# Patient Record
Sex: Male | Born: 1955 | Race: White | Hispanic: No | Marital: Single | Smoking: Never smoker
Health system: Southern US, Community
[De-identification: ages and names within clinical notes are randomized; demographics above are authoritative.]

## PROBLEM LIST (undated history)

## (undated) DIAGNOSIS — I10 Essential (primary) hypertension: Secondary | ICD-10-CM

## (undated) DIAGNOSIS — I219 Acute myocardial infarction, unspecified: Secondary | ICD-10-CM

## (undated) DIAGNOSIS — I2510 Atherosclerotic heart disease of native coronary artery without angina pectoris: Secondary | ICD-10-CM

## (undated) DIAGNOSIS — E785 Hyperlipidemia, unspecified: Secondary | ICD-10-CM

## (undated) HISTORY — DX: Essential (primary) hypertension: I10

## (undated) HISTORY — PX: TONSILLECTOMY: SUR1361

## (undated) HISTORY — PX: WISDOM TOOTH EXTRACTION: SHX21

## (undated) HISTORY — PX: ACHILLES TENDON REPAIR: SUR1153

## (undated) HISTORY — DX: Atherosclerotic heart disease of native coronary artery without angina pectoris: I25.10

## (undated) HISTORY — DX: Hyperlipidemia, unspecified: E78.5

---

## 2012-10-18 DIAGNOSIS — I219 Acute myocardial infarction, unspecified: Secondary | ICD-10-CM

## 2012-10-18 HISTORY — DX: Acute myocardial infarction, unspecified: I21.9

## 2013-05-22 ENCOUNTER — Encounter (HOSPITAL_COMMUNITY): Payer: Self-pay | Admitting: Emergency Medicine

## 2013-05-22 ENCOUNTER — Emergency Department (HOSPITAL_COMMUNITY): Payer: BC Managed Care – PPO

## 2013-05-22 ENCOUNTER — Encounter (HOSPITAL_COMMUNITY)
Admission: EM | Disposition: A | Discharge status: Home / Self Care | Payer: Self-pay | Source: Home / Self Care | Attending: Cardiology

## 2013-05-22 ENCOUNTER — Inpatient Hospital Stay (HOSPITAL_COMMUNITY)
Admission: EM | Admit: 2013-05-22 | Discharge: 2013-05-24 | DRG: 853 | Disposition: A | Discharge status: Home / Self Care | Payer: BC Managed Care – PPO | Attending: Cardiology | Admitting: Cardiology

## 2013-05-22 DIAGNOSIS — Z79899 Other long term (current) drug therapy: Secondary | ICD-10-CM

## 2013-05-22 DIAGNOSIS — N529 Male erectile dysfunction, unspecified: Secondary | ICD-10-CM | POA: Diagnosis present

## 2013-05-22 DIAGNOSIS — I472 Ventricular tachycardia, unspecified: Secondary | ICD-10-CM | POA: Diagnosis present

## 2013-05-22 DIAGNOSIS — Z955 Presence of coronary angioplasty implant and graft: Secondary | ICD-10-CM

## 2013-05-22 DIAGNOSIS — Z7982 Long term (current) use of aspirin: Secondary | ICD-10-CM

## 2013-05-22 DIAGNOSIS — I4729 Other ventricular tachycardia: Secondary | ICD-10-CM | POA: Diagnosis present

## 2013-05-22 DIAGNOSIS — E876 Hypokalemia: Secondary | ICD-10-CM | POA: Diagnosis not present

## 2013-05-22 DIAGNOSIS — I10 Essential (primary) hypertension: Secondary | ICD-10-CM | POA: Diagnosis present

## 2013-05-22 DIAGNOSIS — I2119 ST elevation (STEMI) myocardial infarction involving other coronary artery of inferior wall: Principal | ICD-10-CM | POA: Diagnosis present

## 2013-05-22 DIAGNOSIS — N289 Disorder of kidney and ureter, unspecified: Secondary | ICD-10-CM | POA: Diagnosis not present

## 2013-05-22 DIAGNOSIS — E7800 Pure hypercholesterolemia, unspecified: Secondary | ICD-10-CM | POA: Diagnosis present

## 2013-05-22 DIAGNOSIS — Z7902 Long term (current) use of antithrombotics/antiplatelets: Secondary | ICD-10-CM

## 2013-05-22 DIAGNOSIS — I213 ST elevation (STEMI) myocardial infarction of unspecified site: Secondary | ICD-10-CM

## 2013-05-22 HISTORY — PX: LEFT HEART CATHETERIZATION WITH CORONARY ANGIOGRAM: SHX5451

## 2013-05-22 LAB — POCT I-STAT, CHEM 8
BUN: 16 mg/dL (ref 6–23)
Creatinine, Ser: 1.4 mg/dL — ABNORMAL HIGH (ref 0.50–1.35)
Glucose, Bld: 135 mg/dL — ABNORMAL HIGH (ref 70–99)
Hemoglobin: 18.7 g/dL — ABNORMAL HIGH (ref 13.0–17.0)
TCO2: 26 mmol/L (ref 0–100)

## 2013-05-22 LAB — MAGNESIUM: Magnesium: 2 mg/dL (ref 1.5–2.5)

## 2013-05-22 LAB — COMPREHENSIVE METABOLIC PANEL
ALT: 40 U/L (ref 0–53)
AST: 31 U/L (ref 0–37)
Alkaline Phosphatase: 64 U/L (ref 39–117)
CO2: 25 mEq/L (ref 19–32)
Chloride: 100 mEq/L (ref 96–112)
GFR calc Af Amer: 86 mL/min — ABNORMAL LOW (ref >90)
GFR calc non Af Amer: 74 mL/min — ABNORMAL LOW (ref >90)
Glucose, Bld: 133 mg/dL — ABNORMAL HIGH (ref 70–99)
Potassium: 3.6 mEq/L (ref 3.5–5.1)
Sodium: 137 mEq/L (ref 135–145)
Total Bilirubin: 0.4 mg/dL (ref 0.3–1.2)

## 2013-05-22 LAB — DIFFERENTIAL
Basophils Absolute: 0 10*3/uL (ref 0.0–0.1)
Basophils Relative: 0 % (ref 0–1)
Eosinophils Absolute: 0.2 10*3/uL (ref 0.0–0.7)
Neutro Abs: 5 10*3/uL (ref 1.7–7.7)
Neutrophils Relative %: 66 % (ref 43–77)

## 2013-05-22 LAB — LIPID PANEL
Cholesterol: 216 mg/dL — ABNORMAL HIGH (ref 0–200)
Total CHOL/HDL Ratio: 4.4 RATIO
VLDL: 51 mg/dL — ABNORMAL HIGH (ref 0–40)

## 2013-05-22 LAB — TROPONIN I: Troponin I: 19.07 ng/mL (ref <0.30)

## 2013-05-22 LAB — CBC
Hemoglobin: 18.4 g/dL — ABNORMAL HIGH (ref 13.0–17.0)
MCH: 33.5 pg (ref 26.0–34.0)
Platelets: 208 10*3/uL (ref 150–400)
RBC: 5.49 MIL/uL (ref 4.22–5.81)
WBC: 7.4 10*3/uL (ref 4.0–10.5)

## 2013-05-22 LAB — MRSA PCR SCREENING: MRSA by PCR: NEGATIVE

## 2013-05-22 SURGERY — LEFT HEART CATHETERIZATION WITH CORONARY ANGIOGRAM
Anesthesia: LOCAL

## 2013-05-22 MED ORDER — HEPARIN SODIUM (PORCINE) 5000 UNIT/ML IJ SOLN
4000.0000 [IU] | INTRAMUSCULAR | Status: AC
  Administered 2013-05-22: 4000 [IU] via INTRAVENOUS
  Filled 2013-05-22: qty 1

## 2013-05-22 MED ORDER — ALUM & MAG HYDROXIDE-SIMETH 200-200-20 MG/5ML PO SUSP
30.0000 mL | ORAL | Status: DC | PRN
Start: 2013-05-22 — End: 2013-05-24
  Administered 2013-05-22: 30 mL via ORAL
  Filled 2013-05-22: qty 30

## 2013-05-22 MED ORDER — SODIUM CHLORIDE 0.9 % IV SOLN
INTRAVENOUS | Status: DC
  Administered 2013-05-22: 18:00:00 via INTRAVENOUS

## 2013-05-22 MED ORDER — BIVALIRUDIN 250 MG IV SOLR
INTRAVENOUS | Status: AC
  Filled 2013-05-22: qty 250

## 2013-05-22 MED ORDER — ONDANSETRON HCL 4 MG/2ML IJ SOLN: 4.0000 mg | Freq: Four times a day (QID) | INTRAMUSCULAR | Status: DC | PRN

## 2013-05-22 MED ORDER — MORPHINE SULFATE 2 MG/ML IJ SOLN
INTRAMUSCULAR | Status: AC
  Administered 2013-05-22: 2 mg via INTRAVENOUS
  Filled 2013-05-22: qty 1

## 2013-05-22 MED ORDER — ACETAMINOPHEN 325 MG PO TABS: 650.0000 mg | ORAL_TABLET | ORAL | Status: DC | PRN

## 2013-05-22 MED ORDER — ALPRAZOLAM 0.25 MG PO TABS
0.2500 mg | ORAL_TABLET | Freq: Two times a day (BID) | ORAL | Status: DC | PRN
  Administered 2013-05-22 – 2013-05-23 (×2): 0.25 mg via ORAL
  Filled 2013-05-22 (×2): qty 1

## 2013-05-22 MED ORDER — METOPROLOL TARTRATE 12.5 MG HALF TABLET
12.5000 mg | ORAL_TABLET | Freq: Two times a day (BID) | ORAL | Status: DC
  Administered 2013-05-22: 12.5 mg via ORAL
  Filled 2013-05-22 (×2): qty 1

## 2013-05-22 MED ORDER — NITROGLYCERIN 0.2 MG/ML ON CALL CATH LAB
INTRAVENOUS | Status: AC
  Filled 2013-05-22: qty 1

## 2013-05-22 MED ORDER — SODIUM CHLORIDE 0.9 % IV SOLN: INTRAVENOUS | Status: AC

## 2013-05-22 MED ORDER — POTASSIUM CHLORIDE CRYS ER 20 MEQ PO TBCR
40.0000 meq | EXTENDED_RELEASE_TABLET | Freq: Once | ORAL | Status: AC
  Administered 2013-05-22: 40 meq via ORAL
  Filled 2013-05-22: qty 2

## 2013-05-22 MED ORDER — ASPIRIN 81 MG PO CHEW: 324.0000 mg | CHEWABLE_TABLET | ORAL | Status: AC

## 2013-05-22 MED ORDER — MORPHINE SULFATE 2 MG/ML IJ SOLN
2.0000 mg | INTRAMUSCULAR | Status: DC | PRN
  Administered 2013-05-23: 2 mg via INTRAVENOUS
  Filled 2013-05-22: qty 1

## 2013-05-22 MED ORDER — TICAGRELOR 90 MG PO TABS
180.0000 mg | ORAL_TABLET | ORAL | Status: AC
  Administered 2013-05-22: 180 mg via ORAL
  Filled 2013-05-22: qty 2

## 2013-05-22 MED ORDER — MIDAZOLAM HCL 2 MG/2ML IJ SOLN
INTRAMUSCULAR | Status: AC
  Filled 2013-05-22: qty 2

## 2013-05-22 MED ORDER — NITROGLYCERIN IN D5W 200-5 MCG/ML-% IV SOLN: 5.0000 ug/min | INTRAVENOUS | Status: DC

## 2013-05-22 MED ORDER — PANTOPRAZOLE SODIUM 40 MG PO TBEC
40.0000 mg | DELAYED_RELEASE_TABLET | Freq: Every day | ORAL | Status: DC
  Administered 2013-05-22 – 2013-05-24 (×3): 40 mg via ORAL
  Filled 2013-05-22 (×3): qty 1

## 2013-05-22 MED ORDER — HEPARIN (PORCINE) IN NACL 2-0.9 UNIT/ML-% IJ SOLN
INTRAMUSCULAR | Status: AC
  Filled 2013-05-22: qty 1000

## 2013-05-22 MED ORDER — ATORVASTATIN CALCIUM 80 MG PO TABS
80.0000 mg | ORAL_TABLET | Freq: Every day | ORAL | Status: DC
  Administered 2013-05-22 – 2013-05-23 (×2): 80 mg via ORAL
  Filled 2013-05-22 (×3): qty 1

## 2013-05-22 MED ORDER — FENTANYL CITRATE 0.05 MG/ML IJ SOLN
INTRAMUSCULAR | Status: AC
  Filled 2013-05-22: qty 2

## 2013-05-22 MED ORDER — ASPIRIN 300 MG RE SUPP: 300.0000 mg | RECTAL | Status: AC

## 2013-05-22 MED ORDER — SODIUM CHLORIDE 0.9 % IV SOLN
0.2500 mg/kg/h | INTRAVENOUS | Status: AC
  Administered 2013-05-22: 0.25 mg/kg/h via INTRAVENOUS

## 2013-05-22 MED ORDER — METOPROLOL TARTRATE 25 MG PO TABS
25.0000 mg | ORAL_TABLET | Freq: Two times a day (BID) | ORAL | Status: DC
  Administered 2013-05-22 – 2013-05-24 (×4): 25 mg via ORAL
  Filled 2013-05-22 (×5): qty 1

## 2013-05-22 MED ORDER — TICAGRELOR 90 MG PO TABS
90.0000 mg | ORAL_TABLET | Freq: Two times a day (BID) | ORAL | Status: DC
  Administered 2013-05-22 – 2013-05-24 (×5): 90 mg via ORAL
  Filled 2013-05-22 (×6): qty 1

## 2013-05-22 MED ORDER — NITROGLYCERIN 0.4 MG SL SUBL: 0.4000 mg | SUBLINGUAL_TABLET | SUBLINGUAL | Status: DC | PRN

## 2013-05-22 MED ORDER — ASPIRIN 81 MG PO CHEW
324.0000 mg | CHEWABLE_TABLET | Freq: Once | ORAL | Status: AC
  Administered 2013-05-22: 324 mg via ORAL
  Filled 2013-05-22: qty 4

## 2013-05-22 MED ORDER — LIDOCAINE HCL (PF) 1 % IJ SOLN
INTRAMUSCULAR | Status: AC
  Filled 2013-05-22: qty 30

## 2013-05-22 MED ORDER — ASPIRIN EC 81 MG PO TBEC
81.0000 mg | DELAYED_RELEASE_TABLET | Freq: Every day | ORAL | Status: DC
Start: 2013-05-23 — End: 2013-05-24
  Administered 2013-05-23 – 2013-05-24 (×2): 81 mg via ORAL
  Filled 2013-05-22 (×2): qty 1

## 2013-05-22 NOTE — Progress Notes (Signed)
Pt had a 4 beat run of non sustian vtach. Pt felt transient heaviness in chest and  Racing of hear.. Will continue to monitor

## 2013-05-22 NOTE — Cardiovascular Report (Signed)
NAMEHERNANDEZ, LOSASSO NO.:  [id number]  MEDICAL RECORD NO.:  [id number]  LOCATION:  2908                         FACILITY:  [facility]  PHYSICIAN:  Eduardo Osier. Sharyn Lull, M.D. DATE OF BIRTH:  Jul 11, 1956  DATE OF PROCEDURE:  05/22/2013 DATE OF DISCHARGE:                           CARDIAC CATHETERIZATION   PROCEDURES: 1. Left cardiac cath with selective left and right coronary     angiography, left ventriculography via right groin using Judkins     technique. 2. Successful percutaneous transluminal coronary angioplasty to apical     left anterior descending using 2.0 x 12-mm long Sprinter balloon 3. Successful deployment of 2.5 x 18-mm long Xience expedition drug-     eluting stent in epical left anterior descending.  INDICATION FOR THE PROCEDURE:  Mr. Salzman is 57 year old male with no significant past medical history except for hypercholesteremia, controlled by diet.  He drove to the ER complaining of retrosternal chest pain, described as burning, pressure, grade 8/10, radiating to left arm associated with nausea and diaphoresis.  He took antacids without relief, so decided to drive to the ER.  EKG done in the ER showed normal sinus rhythm with ST elevation in inferolateral leads and reciprocal changes in aVL, and also poor R-wave progression in V1 to V3, suggestive of acute anterolateral injury.  The patient denies such episodes of chest pain in the past.  Denies any cocaine abuse.  States he takes NSAIDs and testosterone off and on.  PHYSICAL EXAMINATION:  GENERAL:  He was alert, awake, oriented x3, in no acute distress. VITAL SIGNS:  Blood pressure was 183/100, pulse was 84.  He was afebrile. HEENT:  Conjunctivae was pink. NECK:  Supple.  No JVD.  No bruit. LUNGS:  Clear to auscultation without rhonchi or rales. CARDIOVASCULAR:  S1, S2 was normal.  There was soft systolic murmur and S4 gallop. ABDOMEN:  Soft.  Bowel sounds were present.  Nontender. EXTREMITIES:   There was no clubbing, cyanosis, or edema.  IMPRESSION:  Acute inferolateral injury, new-onset hypertension, hypercholesteremia, discussed with the patient regarding emergency left cath, possible PTCA stenting, its risks and benefits, i.e., death, MI, stroke, need for emergency CABG, local vascular complications, etc. and consented for PCI procedure.  PROCEDURE IN DETAIL:  After obtaining the informed consent, the patient was brought to the cath lab and was placed on fluoroscopy table.  Right groin was prepped and draped in usual fashion.  Xylocaine 1% was used for local anesthesia in the right groin.  With the help of thin wall needle, 6-French arterial sheath was placed.  The sheath was aspirated and flushed.  Next, 6-French left Judkins catheter was advanced over the wire under fluoroscopic guidance up to the ascending aorta.  Wire was pulled out.  The catheter was aspirated and connected to the Manifold. Catheter was further advanced and engaged into left coronary ostium. Multiple views of the left system were taken.  Next, catheter was disengaged and was pulled out over the wire and was replaced with 6- [place] right Judkins catheter, which was advanced over the wire under fluoroscopic guidance up to the ascending aorta.  Wire was pulled out. The catheter was aspirated and connected to  the Manifold.  Catheter was further advanced and engaged into right coronary ostium.  Multiple views of the right system were taken.  Next, catheter was disengaged and was pulled out over the wire and was replaced with 6-French pigtail catheter at the end of the procedure, which was advanced over the wire under fluoroscopic guidance up to the ascending aorta.  Catheter was further advanced across the aortic valve into the LV.  LV pressures were recorded.  Next, LV graft was done in 30-degree RAO position.  Post- angiographic pressures were recorded from LV and then pullback pressures were recorded  from aorta.  There was no gradient across the aortic valve.  Next, the pigtail catheter was pulled out over the wire. Sheaths were aspirated and flushed.  FINDINGS:  LV showed anterolateral/apical hypokinesia, EF of approximately 50-55%.  Left main was patent.  LAD has 30-40% mid and distal focal stenosis and 95-99% apical stenosis.  Diagonal 1 has 80-90% proximal bifurcation stenosis.  Vessel is very small.  Mid TIMI-3 flow. Diagonal 2 and 3 were very very small.  Ramus has 20-25% proximal stenosis.  Left circumflex is large with minimal plaquing.  OM 1 to OM 3 were very small.  OM 4 has 40-50% proximal stenosis.  RCA has 30-40% proximal and 20-25% mid stenosis.  PDA and PLV branches were very small. The patient has codominant coronary system.  INTERVENTIONAL PROCEDURE:  Successful PTCA to apical LAD was done using 2.0 x 12-mm long Sprinter balloon for predilatation and then 2.5 x 18-mm long Xience drug-eluting stent was deployed in the apical LAD at 8 atmospheric pressure.  The stent was post dilated going up to 10 atmospheric pressure.  Lesion dilated from 95-99% to 0% residual with excellent TIMI grade 3 distal flow without evidence of dissection or distal embolization.  The patient received weight-based Angiomax and 180 mg of Brilinta prior to the procedure.  The patient tolerated the procedure well.  There were no complications.  The patient was transferred to CCU in stable condition.     Eduardo Osier. Sharyn Lull, M.D.     [facility]/MEDQ  D:  05/22/2013  T:  05/22/2013  Job:  [id number]

## 2013-05-22 NOTE — Progress Notes (Signed)
Subjective:  Patient denies any anginal chest pain or shortness of breath. Denies any palpitations. Had episode of a few beats of nonsustained VT on the monitor asymptomatic potassium is 3.5 which is being replaced. ST elevation resolved after PCI in inferior leads.  Objective:  Vital Signs in the last 24 hours: Temp:  [97.7 F (36.5 C)-98.5 F (36.9 C)] 97.7 F (36.5 C) (08/05 0800) Pulse Rate:  [56-84] 66 (08/05 0845) Resp:  [14-29] 22 (08/05 0845) BP: (141-186)/(69-106) 148/69 mmHg (08/05 0845) SpO2:  [97 %-99 %] 98 % (08/05 0845) Arterial Line BP: (147-158)/(80-85) 158/80 mmHg (08/05 0800) Weight:  [106.595 kg (235 lb)-111.4 kg (245 lb 9.5 oz)] 111.4 kg (245 lb 9.5 oz) (08/05 0445)  Intake/Output from previous day: 08/04 0701 - 08/05 0700 In: 55.9 [I.V.:55.9] Out: -  Intake/Output from this shift: Total I/O In: 146 [P.O.:120; I.V.:26] Out: 700 [Urine:700]  Physical Exam: Neck: no adenopathy, no carotid bruit, no JVD and supple, symmetrical, trachea midline Lungs: clear to auscultation bilaterally Heart: regular rate and rhythm, S1, S2 normal and Soft systolic murmur and S4 gallop noted Abdomen: soft, non-tender; bowel sounds normal; no masses,  no organomegaly Extremities: No clubbing cyanosis or edema right groin stable dressing dry no bruit  Lab Results:  Recent Labs  05/22/13 0210 05/22/13 0223  WBC 7.4  --   HGB 18.4* 18.7*  PLT 208  --     Recent Labs  05/22/13 0210 05/22/13 0223  NA 137 140  K 3.6 3.5  CL 100 101  CO2 25  --   GLUCOSE 133* 135*  BUN 17 16  CREATININE 1.10 1.40*   No results found for this basename: TROPONINI, CK, MB,  in the last 72 hours Hepatic Function Panel  Recent Labs  05/22/13 0210  PROT 7.0  ALBUMIN 3.9  AST 31  ALT 40  ALKPHOS 64  BILITOT 0.4    Recent Labs  05/22/13 0210  CHOL 216*   No results found for this basename: PROTIME,  in the last 72 hours  Imaging: Imaging results have been reviewed and Dg  Chest Port 1 View  05/22/2013   *RADIOLOGY REPORT*  Clinical Data: Chest pain.  PORTABLE CHEST - 1 VIEW  Comparison: None.  Findings: The lungs are clear.  Heart size is normal.  No pneumothorax or pleural fluid.  No focal bony abnormality.  IMPRESSION: Negative exam.   Original Report Authenticated By: Holley Dexter, M.D.    Cardiac Studies:  Assessment/Plan:  Status post acute inferior apical wall myocardial infarction status post PCI to apical LAD with excellent results Hypertension Status post nonsustained VT asymptomatic Hypokalemia Mild renal insufficiency Plan Replace K. Check labs Continue present management as per orders  LOS: 0 days    Pepper Kerrick N 05/22/2013, 10:19 AM

## 2013-05-22 NOTE — Progress Notes (Signed)
RFA sheath removed at 0815 without complications. Pressure to site x 25 min .Site level zero.Right DP palpable.Dressing applied,pt teaching done.       canderson rn

## 2013-05-22 NOTE — Care Management Note (Signed)
    Page 1 of 1   05/22/2013     10:32:36 AM   CARE MANAGEMENT NOTE 05/22/2013  Patient:  Donald Calhoun,Donald Calhoun   Account Number:  [id number]  Date Initiated:  05/22/2013  Documentation initiated by:  Junius Creamer  Subjective/Objective Assessment:   adm w mi     Action/Plan:   lives w fam  pt states has bcbs ins and gave copay of card on adm   Anticipated DC Date:     Anticipated DC Plan:  HOME/SELF CARE      DC Planning Services  CM consult  Medication Assistance      Choice offered to / List presented to:             Status of service:   Medicare Important Message given?   (If response is "NO", the following Medicare IM given date fields will be blank) Date Medicare IM given:   Date Additional Medicare IM given:    Discharge Disposition:  HOME/SELF CARE  Per UR Regulation:  Reviewed for med. necessity/level of care/duration of stay  If discussed at Long Length of Stay Meetings, dates discussed:    Comments:  8/5 1031a debbie Michiko Lineman rn,bsn spoke w pt. gave pt brilinta 30day free and copay assist card. pt states has bcbs and gave copy of card when adm.

## 2013-05-22 NOTE — ED Notes (Addendum)
Pt reports approx 1 hour ago he began to have mid-sternal chest heaviness with diaphoresis.  Pain radiates down left arm. Denies sob, nv. No cardiac hx. Pt took antacids at home with no relief.

## 2013-05-22 NOTE — H&P (Signed)
Donald Calhoun is an 56 y.o. male.   Chief Complaint: Chest pain HPI: Patient is 57 year old male with no significant past medical history except for hypercholesteremia controlled by diet control to the ER complaining of retrosternal chest pain described as burning pressure grade 8/10 radiating to left arm associated with nausea and diaphoresis took antacids without relief so decided to to the ER EKG done in the ER showed normal sinus rhythm with ST elevation in inferolateral leads with reciprocal changes in aVL and over R-wave progression in V1 to V3 suggestive of acute inferior lateral injury. Patient denies such episodes of chest pain in the past. Denies any cocaine abuse. States takes her NSAIDs and testosterone off and on.  History reviewed. No pertinent past medical history.  History reviewed. No pertinent past surgical history.  No family history on file. Social History:  reports that he has never smoked. He does not have any smokeless tobacco history on file. He reports that  drinks alcohol. He reports that he does not use illicit drugs.  Allergies: No Known Allergies  Medications Prior to Admission  Medication Sig Dispense Refill  . lisdexamfetamine (VYVANSE) 50 MG capsule Take 100 mg by mouth every morning.      . testosterone cypionate (DEPOTESTOTERONE CYPIONATE) 100 MG/ML injection Inject 100 mg into the muscle every 7 (seven) days. On Friday        Results for orders placed during the hospital encounter of 05/22/13 (from the past 48 hour(s))  APTT     Status: None   Collection Time    05/22/13  2:10 AM      Result Value Range   aPTT 27  24 - 37 seconds  CBC     Status: Abnormal   Collection Time    05/22/13  2:10 AM      Result Value Range   WBC 7.4  4.0 - 10.5 K/uL   RBC 5.49  4.22 - 5.81 MIL/uL   Hemoglobin 18.4 (*) 13.0 - 17.0 g/dL   HCT 16.1  09.6 - 04.5 %   MCV 93.6  78.0 - 100.0 fL   MCH 33.5  26.0 - 34.0 pg   MCHC 35.8  30.0 - 36.0 g/dL   RDW 40.9  81.1 - 91.4 %    Platelets 208  150 - 400 K/uL  COMPREHENSIVE METABOLIC PANEL     Status: Abnormal   Collection Time    05/22/13  2:10 AM      Result Value Range   Sodium 137  135 - 145 mEq/L   Potassium 3.6  3.5 - 5.1 mEq/L   Chloride 100  96 - 112 mEq/L   CO2 25  19 - 32 mEq/L   Glucose, Bld 133 (*) 70 - 99 mg/dL   BUN 17  6 - 23 mg/dL   Creatinine, Ser 7.82  0.50 - 1.35 mg/dL   Calcium 9.3  8.4 - 95.6 mg/dL   Total Protein 7.0  6.0 - 8.3 g/dL   Albumin 3.9  3.5 - 5.2 g/dL   AST 31  0 - 37 U/L   ALT 40  0 - 53 U/L   Alkaline Phosphatase 64  39 - 117 U/L   Total Bilirubin 0.4  0.3 - 1.2 mg/dL   GFR calc non Af Amer 74 (*) >90 mL/min   GFR calc Af Amer 86 (*) >90 mL/min   Comment:            The eGFR has been calculated  using the CKD EPI equation.     This calculation has not been     validated in all clinical     situations.     eGFR's persistently     <90 mL/min signify     possible Chronic Kidney Disease.  PROTIME-INR     Status: None   Collection Time    05/22/13  2:10 AM      Result Value Range   Prothrombin Time 13.1  11.6 - 15.2 seconds   INR 1.01  0.00 - 1.49  POCT I-STAT TROPONIN I     Status: None   Collection Time    05/22/13  2:20 AM      Result Value Range   Troponin i, poc 0.04  0.00 - 0.08 ng/mL   Comment 3            Comment: Due to the release kinetics of cTnI,     a negative result within the first hours     of the onset of symptoms does not rule out     myocardial infarction with certainty.     If myocardial infarction is still suspected,     repeat the test at appropriate intervals.  POCT I-STAT, CHEM 8     Status: Abnormal   Collection Time    05/22/13  2:23 AM      Result Value Range   Sodium 140  135 - 145 mEq/L   Potassium 3.5  3.5 - 5.1 mEq/L   Chloride 101  96 - 112 mEq/L   BUN 16  6 - 23 mg/dL   Creatinine, Ser 7.82 (*) 0.50 - 1.35 mg/dL   Glucose, Bld 956 (*) 70 - 99 mg/dL   Calcium, Ion 2.13  0.86 - 1.23 mmol/L   TCO2 26  0 - 100 mmol/L    Hemoglobin 18.7 (*) 13.0 - 17.0 g/dL   HCT 57.8 (*) 46.9 - 62.9 %   Dg Chest Port 1 View  05/22/2013   *RADIOLOGY REPORT*  Clinical Data: Chest pain.  PORTABLE CHEST - 1 VIEW  Comparison: None.  Findings: The lungs are clear.  Heart size is normal.  No pneumothorax or pleural fluid.  No focal bony abnormality.  IMPRESSION: Negative exam.   Original Report Authenticated By: Holley Dexter, M.D.    Review of Systems  Constitutional: Negative for fever.  Eyes: Negative for blurred vision and double vision.  Respiratory: Negative for cough, hemoptysis and sputum production.   Cardiovascular: Positive for chest pain. Negative for palpitations, orthopnea, claudication, leg swelling and PND.  Gastrointestinal: Positive for nausea. Negative for vomiting and abdominal pain.  Neurological: Negative for dizziness and headaches.    Blood pressure 183/100, pulse 84, temperature 98 F (36.7 C), temperature source Oral, resp. rate 29, height 6\' 3"  (1.905 m), weight 106.595 kg (235 lb), SpO2 98.00%. Physical Exam  Constitutional: He is oriented to person, place, and time.  HENT:  Head: Normocephalic and atraumatic.  Eyes: Conjunctivae are normal. Pupils are equal, round, and reactive to light. Left eye exhibits no discharge.  Neck: Normal range of motion. Neck supple. No JVD present. No thyromegaly present.  Cardiovascular: Normal rate and regular rhythm.   Murmur (Soft systolic murmur and S4 gallop noted) heard. Respiratory: Effort normal and breath sounds normal. No respiratory distress. He has no wheezes. He has no rales.  GI: Bowel sounds are normal.  Musculoskeletal: He exhibits no edema and no tenderness.  Neurological: He is alert and oriented to person,  place, and time.     Assessment/Plan Acute inferolateral injury The onset hypertension Hypercholesteremia Plan Discussed with patient regarding emergency left cath possible PTCA stenting its risk and benefits i.e. death MI stroke need  for emergency CABG local last complications etc. and consented for PCI  [facility] N 05/22/2013, 4:11 AM

## 2013-05-22 NOTE — CV Procedure (Signed)
(/  PTCA stenting report dictated on 05/22/2013 dictation number is [phone]

## 2013-05-22 NOTE — Progress Notes (Signed)
Subjective:  Patient complains of vague pleuritic chest pain earlier today localized without associated symptoms repeat EKG showed no acute ischemic changes. Had frequent episodes of nonsustained VT asymptomatic. Feels better now  Objective:  Vital Signs in the last 24 hours: Temp:  [97.7 F (36.5 C)-98.5 F (36.9 C)] 97.8 F (36.6 C) (08/05 1600) Pulse Rate:  [53-84] 53 (08/05 1200) Resp:  [14-29] 17 (08/05 1200) BP: (129-186)/(69-106) 130/76 mmHg (08/05 1600) SpO2:  [97 %-99 %] 97 % (08/05 1600) Arterial Line BP: (147-158)/(80-85) 158/80 mmHg (08/05 0800) Weight:  [106.595 kg (235 lb)-111.4 kg (245 lb 9.5 oz)] 111.4 kg (245 lb 9.5 oz) (08/05 0445)  Intake/Output from previous day: 08/04 0701 - 08/05 0700 In: 55.9 [I.V.:55.9] Out: -  Intake/Output from this shift: Total I/O In: 1020 [P.O.:840; I.V.:180] Out: 700 [Urine:700]  Physical Exam: Neck: no adenopathy, no carotid bruit, no JVD and supple, symmetrical, trachea midline Lungs: clear to auscultation bilaterally Heart: regular rate and rhythm, S1, S2 normal and Soft systolic murmur noted Abdomen: soft, non-tender; bowel sounds normal; no masses,  no organomegaly Extremities: extremities normal, atraumatic, no cyanosis or edema and Right groin stable  Lab Results:  Recent Labs  05/22/13 0210 05/22/13 0223  WBC 7.4  --   HGB 18.4* 18.7*  PLT 208  --     Recent Labs  05/22/13 0210 05/22/13 0223  NA 137 140  K 3.6 3.5  CL 100 101  CO2 25  --   GLUCOSE 133* 135*  BUN 17 16  CREATININE 1.10 1.40*    Recent Labs  05/22/13 1045  TROPONINI 19.07*   Hepatic Function Panel  Recent Labs  05/22/13 0210  PROT 7.0  ALBUMIN 3.9  AST 31  ALT 40  ALKPHOS 64  BILITOT 0.4    Recent Labs  05/22/13 0210  CHOL 216*   No results found for this basename: PROTIME,  in the last 72 hours  Imaging: Imaging results have been reviewed and Dg Chest Port 1 View  05/22/2013   *RADIOLOGY REPORT*  Clinical Data:  Chest pain.  PORTABLE CHEST - 1 VIEW  Comparison: None.  Findings: The lungs are clear.  Heart size is normal.  No pneumothorax or pleural fluid.  No focal bony abnormality.  IMPRESSION: Negative exam.   Original Report Authenticated By: Holley Dexter, M.D.    Cardiac Studies:  Assessment/Plan:  Status post atypical chest pain Status post acute inferior apical wall myocardial infarction status post PCI to apical LAD with excellent results  Hypertension  Status post nonsustained VT asymptomatic  Hypokalemia  Mild renal insufficiency  Plan Increase beta blockers as per orders Check labs in a.m.  LOS: 0 days    Donald Calhoun 05/22/2013, 5:07 PM

## 2013-05-22 NOTE — ED Provider Notes (Signed)
CSN: [id number]     Arrival date & time 05/22/13  0200 History     First MD Initiated Contact with Patient 05/22/13 0209     Chief Complaint  Patient presents with  . Code STEMI    Patient is a 57 y.o. male presenting with chest pain. The history is provided by the patient.  Chest Pain Pain location:  Substernal area Pain quality: aching and burning   Pain radiates to:  L arm Pain radiates to the back: no   Pain severity:  Moderate Duration:  1 hour Timing:  Constant Progression:  Unchanged Chronicity:  New Context comment:  Pt was at reset Relieved by:  Nothing Worsened by:  Nothing tried Associated symptoms: diaphoresis and nausea   Associated symptoms: no abdominal pain, no dizziness, no fever and not vomiting   Risk factors: no coronary artery disease, no diabetes mellitus, not obese and no smoking     History reviewed. No pertinent past medical history. History reviewed. No pertinent past surgical history. No family history on file. History  Substance Use Topics  . Smoking status: Never Smoker   . Smokeless tobacco: Not on file  . Alcohol Use: Yes    Review of Systems  Constitutional: Positive for diaphoresis. Negative for fever.  Cardiovascular: Positive for chest pain.  Gastrointestinal: Positive for nausea. Negative for vomiting and abdominal pain.  Neurological: Negative for dizziness.  All other systems reviewed and are negative.    Allergies  Review of patient's allergies indicates no known allergies.  Home Medications  No current outpatient prescriptions on file. BP 186/106  Pulse 84  Temp(Src) 98 F (36.7 C) (Oral)  Resp 14  Ht 6\' 3"  (1.905 m)  Wt 235 lb (106.595 kg)  BMI 29.37 kg/m2  SpO2 99% Physical Exam  Nursing note and vitals reviewed. Constitutional: He appears well-developed and well-nourished. No distress.  HENT:  Head: Normocephalic and atraumatic.  Right Ear: External ear normal.  Left Ear: External ear normal.  Eyes:  Conjunctivae are normal. Right eye exhibits no discharge. Left eye exhibits no discharge. No scleral icterus.  Neck: Neck supple. No tracheal deviation present.  Cardiovascular: Normal rate, regular rhythm and intact distal pulses.   Pulmonary/Chest: Effort normal and breath sounds normal. No stridor. No respiratory distress. He has no wheezes. He has no rales.  Abdominal: Soft. Bowel sounds are normal. He exhibits no distension. There is no tenderness. There is no rebound and no guarding.  Musculoskeletal: He exhibits no edema and no tenderness.  Normal pulses in all extremities  Neurological: He is alert. He has normal strength. No sensory deficit. Cranial nerve deficit:  no gross defecits noted. He exhibits normal muscle tone. He displays no seizure activity. Coordination normal.  Skin: Skin is warm and dry. No rash noted.  Psychiatric: He has a normal mood and affect.    ED Course   Procedures (including critical care time) CRITICAL CARE Performed by: Linwood Dibbles R Total critical care time: 30 Critical care time was exclusive of separately billable procedures and treating other patients. Critical care was necessary to treat or prevent imminent or life-threatening deterioration. Critical care was time spent personally by me on the following activities: development of treatment plan with patient and/or surrogate as well as nursing, discussions with consultants, evaluation of patient's response to treatment, examination of patient, obtaining history from patient or surrogate, ordering and performing treatments and interventions, ordering and review of laboratory studies, ordering and review of radiographic studies, pulse oximetry and re-evaluation of  patient's condition.  EKG Normal sinus rhythm, rate 77 Normal axis, normal intervals ST elevation inferior leads with reciprocal changes in aVR and aVL Acute ST elevation MI  Labs Reviewed  APTT  CBC  COMPREHENSIVE METABOLIC PANEL   PROTIME-INR  POCT I-STAT, CHEM 8   No results found. Diagnosis: Acute ST elevation MI  MDM  The patient's EKG suggests an acute ST elevation MI. Immediately after being shown EKG activated a code STEMI. Per protocol patient was given aspirin and heparin. I spoke with Dr. Sharyn Lull he also requested Brilanta.  The patient will be monitored closely. Plan is to take him emergently to the cardiac catheterization lab.  Celene Kras, MD 05/22/13 [phone]

## 2013-05-22 NOTE — Progress Notes (Signed)
Chaplain responded to code stemi page and reported to ED D31. Pt was alert and responsive. After doctor's evaluation pt was taken to the Cath Lab for a procedure. No family member was present. Pt didn't want any family member to be notified. Chaplain shared words of encouragement, provided ministry of presence, said a silent prayer for pt. Pt was later admitted at 2908. Chaplain will follow up as needed at a later time. Kelle Darting [phone]  05/22/13 0245  Clinical Encounter Type  Visited With Patient  Visit Type Initial;Spiritual support;Code;Critical Care;ED  Referral From Nurse  Spiritual Encounters  Spiritual Needs Emotional  Stress Factors  Patient Stress Factors None identified;Health changes

## 2013-05-23 LAB — CBC
Hemoglobin: 16.5 g/dL (ref 13.0–17.0)
MCHC: 35.3 g/dL (ref 30.0–36.0)
Platelets: 167 10*3/uL (ref 150–400)
RBC: 4.89 MIL/uL (ref 4.22–5.81)

## 2013-05-23 LAB — CK TOTAL AND CKMB (NOT AT ARMC)
CK, MB: 12.1 ng/mL (ref 0.3–4.0)
Relative Index: 4.7 — ABNORMAL HIGH (ref 0.0–2.5)
Total CK: 259 U/L — ABNORMAL HIGH (ref 7–232)

## 2013-05-23 LAB — BASIC METABOLIC PANEL
Calcium: 8.9 mg/dL (ref 8.4–10.5)
GFR calc non Af Amer: 68 mL/min — ABNORMAL LOW (ref >90)
Glucose, Bld: 117 mg/dL — ABNORMAL HIGH (ref 70–99)
Potassium: 3.8 mEq/L (ref 3.5–5.1)
Sodium: 138 mEq/L (ref 135–145)

## 2013-05-23 MED ORDER — RAMIPRIL 5 MG PO CAPS
5.0000 mg | ORAL_CAPSULE | Freq: Two times a day (BID) | ORAL | Status: DC
  Administered 2013-05-23 – 2013-05-24 (×3): 5 mg via ORAL
  Filled 2013-05-23 (×4): qty 1

## 2013-05-23 MED FILL — Sodium Chloride IV Soln 0.9%: INTRAVENOUS | Qty: 50 | Status: AC

## 2013-05-23 NOTE — Progress Notes (Deleted)
05/23/2013 2:35 PM  Dr. Molli Knock and CDS aware of all Lab results.  Debra Colon, Linnell Fulling

## 2013-05-23 NOTE — Progress Notes (Signed)
Subjective:  Patient denies any chest pain or shortness of breath. States had good night sleep.  Objective:  Vital Signs in the last 24 hours: Temp:  [97.5 F (36.4 C)-98.8 F (37.1 C)] 97.6 F (36.4 C) (08/06 1200) BP: (110-167)/(39-90) 154/88 mmHg (08/06 1200) SpO2:  [96 %-98 %] 98 % (08/06 1200)  Intake/Output from previous day: 08/05 0701 - 08/06 0700 In: 1402 [P.O.:1080; I.V.:322] Out: 700 [Urine:700] Intake/Output from this shift: Total I/O In: 722 [P.O.:722] Out: -   Physical Exam: Neck: no adenopathy, no carotid bruit, no JVD and supple, symmetrical, trachea midline Lungs: clear to auscultation bilaterally Heart: regular rate and rhythm, S1, S2 normal and Soft systolic murmur noted no S3 gallop Abdomen: soft, non-tender; bowel sounds normal; no masses,  no organomegaly Extremities: extremities normal, atraumatic, no cyanosis or edema and Right groin stable  Lab Results:  Recent Labs  05/22/13 0210 05/22/13 0223 05/23/13 0500  WBC 7.4  --  9.1  HGB 18.4* 18.7* 16.5  PLT 208  --  167    Recent Labs  05/22/13 0210 05/22/13 0223 05/23/13 0500  NA 137 140 138  K 3.6 3.5 3.8  CL 100 101 101  CO2 25  --  28  GLUCOSE 133* 135* 117*  BUN 17 16 17   CREATININE 1.10 1.40* 1.18    Recent Labs  05/22/13 1652 05/23/13 0500  TROPONINI 14.74* 4.63*   Hepatic Function Panel  Recent Labs  05/22/13 0210  PROT 7.0  ALBUMIN 3.9  AST 31  ALT 40  ALKPHOS 64  BILITOT 0.4    Recent Labs  05/22/13 0210  CHOL 216*   No results found for this basename: PROTIME,  in the last 72 hours  Imaging: Imaging results have been reviewed and Dg Chest Port 1 View  05/22/2013   *RADIOLOGY REPORT*  Clinical Data: Chest pain.  PORTABLE CHEST - 1 VIEW  Comparison: None.  Findings: The lungs are clear.  Heart size is normal.  No pneumothorax or pleural fluid.  No focal bony abnormality.  IMPRESSION: Negative exam.   Original Report Authenticated By: Holley Dexter, M.D.     Cardiac Studies:  Assessment/Plan:  Status post acute inferior apical wall myocardial infarction status post PCI to apical LAD with excellent results  Hypertension  Status post nonsustained VT asymptomatic  Hypokalemia  Mild renal insufficiency improved Plan Add ACE inhibitor as per orders Transfer to telemetry Check labs in a.m.  LOS: 1 day    Annina Piotrowski N 05/23/2013, 12:29 PM

## 2013-05-23 NOTE — Progress Notes (Signed)
CARDIAC REHAB PHASE I   PRE:  Rate/Rhythm: 69SR  BP:  Supine:   Sitting:   Standing: 167/84   SaO2:   MODE:  Ambulation: 740 ft   POST:  Rate/Rhythm: 75 SR  BP:  Supine:   Sitting:   Standing: 172/92   SaO2:  1035-1130 Pt walked 740 ft with steady gait. Tolerated well. No CP. Education completed with pt and family. Understanding voiced. Discussed CRP 2 and permission given to refer to [facility] Phase 2. BP elevated. Pt has brilinta packet. Discussed importance of taking med for stent.   Luetta Nutting, RN BSN  05/23/2013 11:23 AM

## 2013-05-24 LAB — BASIC METABOLIC PANEL
BUN: 20 mg/dL (ref 6–23)
CO2: 30 mEq/L (ref 19–32)
Calcium: 9 mg/dL (ref 8.4–10.5)
Glucose, Bld: 109 mg/dL — ABNORMAL HIGH (ref 70–99)
Potassium: 3.7 mEq/L (ref 3.5–5.1)
Sodium: 139 mEq/L (ref 135–145)

## 2013-05-24 LAB — CBC
Hemoglobin: 17.4 g/dL — ABNORMAL HIGH (ref 13.0–17.0)
MCH: 34 pg (ref 26.0–34.0)
MCHC: 35.8 g/dL (ref 30.0–36.0)
MCV: 94.9 fL (ref 78.0–100.0)
RBC: 5.12 MIL/uL (ref 4.22–5.81)

## 2013-05-24 LAB — CK TOTAL AND CKMB (NOT AT ARMC): Total CK: 147 U/L (ref 7–232)

## 2013-05-24 MED ORDER — METOPROLOL SUCCINATE ER 50 MG PO TB24: 50.0000 mg | ORAL_TABLET | Freq: Every day | ORAL | Status: DC

## 2013-05-24 MED ORDER — TICAGRELOR 90 MG PO TABS: 90.0000 mg | ORAL_TABLET | Freq: Two times a day (BID) | ORAL | Status: DC

## 2013-05-24 MED ORDER — ATORVASTATIN CALCIUM 80 MG PO TABS: 80.0000 mg | ORAL_TABLET | Freq: Every day | ORAL | Status: DC

## 2013-05-24 MED ORDER — RAMIPRIL 10 MG PO CAPS: 10.0000 mg | ORAL_CAPSULE | Freq: Every day | ORAL | Status: DC

## 2013-05-24 MED ORDER — NITROGLYCERIN 0.4 MG SL SUBL: 0.4000 mg | SUBLINGUAL_TABLET | SUBLINGUAL | Status: DC | PRN

## 2013-05-24 MED ORDER — ASPIRIN 81 MG PO TBEC: 81.0000 mg | DELAYED_RELEASE_TABLET | Freq: Every day | ORAL | Status: DC

## 2013-05-24 NOTE — Progress Notes (Signed)
CARDIAC REHAB PHASE I   PRE:  Rate/Rhythm: Sinus Rhythm 60  BP:      Standing: 140/70   SaO2: 95% Room Air  MODE:  Ambulation: 990 ft   POST:  Rate/Rhythem: Sinus Rhythm 64  BP:    Sitting: 98% Room Air    SaO2: 98 Room Air  [phone]  Patient walked in the hallway independently without complaints or chest pain.Tolerated Well.  Harlon Flor, Arta Bruce

## 2013-05-24 NOTE — Discharge Summary (Signed)
  Discharge summary dictated on 05/24/2013 dictation number is [phone]

## 2013-05-24 NOTE — Progress Notes (Signed)
Discharged to home with family office visits in place teaching done  

## 2013-05-25 NOTE — Discharge Summary (Signed)
Donald Calhoun, Donald Calhoun NO.:  [id number]  MEDICAL RECORD NO.:  [id number]  LOCATION:  2025                         FACILITY:  [facility]  PHYSICIAN:  Eduardo Osier. Sharyn Lull, M.D. DATE OF BIRTH:  [birth year]  DATE OF ADMISSION:  05/22/2013 DATE OF DISCHARGE:  05/24/2013                              DISCHARGE SUMMARY   ADMITTING DIAGNOSES: 1. Acute inferolateral injury. 2. New-onset hypertension. 3. Hypercholesteremia. 4. Erectile dysfunction.  FINAL DIAGNOSES: 1. Status post acute inferoapical wall myocardial infarction, status     post emergency percutaneous coronary intervention to apical left     anterior descending artery. 2. Hypertension. 3. Hypercholesteremia. 4. Status post nonsustained ventricular tachycardia, asymptomatic. 5. Erectile dysfunction.  DISCHARGE MEDICATIONS: 1. Enteric-coated aspirin 81 mg 1 tablet daily. 2. Atorvastatin 80 mg 1 tablet daily. 3. Toprol-XL 50 mg 1 tablet daily. 4. Ramipril 10 mg 1 capsule daily. 5. Brilinta 90 mg 1 tablet twice daily. 6. Nitrostat 0.4 mg sublingual use as directed.  The patient has been     advised to stop Vyvanse and also stop testosterone.  DIET:  Low salt, low cholesterol.  ACTIVITY:  Increase activity slowly as tolerated.  The patient will be scheduled for phase 2 cardiac rehab as outpatient. Post-PCI instructions have been given.  Follow up with me in 1 week.  CONDITION AT DISCHARGE:  Stable.  BRIEF HISTORY AND HOSPITAL COURSE:  Mr. Bochicchio is 57 year old male with no significant past medical history except for hypercholesteremia controlled by diet.  He drove to the ER complaining of retrosternal chest pain described as burning pressure, grade 8/10, radiating to left arm associated with nausea and diaphoresis.  He initially took antacids without relief, so decided to come to the ER.  EKG done in the ER showed normal sinus rhythm with ST elevation in inferolateral leads with reciprocal changes in lead  aVL and also poor R-wave progression in V1 to V3, suggestive of acute inferolateral injury pattern.  The patient denies such episodes of chest pain in the past.  Denies cocaine abuse. States, he has been taking NSAIDs and testosterone off and on.  PHYSICAL EXAMINATION:  GENERAL:  He was alert, awake, and oriented x3. VITAL SIGNS:  Blood pressure was 183/100, pulse was 84.  He was afebrile. HEENT:  Conjunctivae was pink. NECK:  Supple.  No JVD.  No bruit. LUNGS:  Clear to auscultation without rhonchi or rales. CARDIOVASCULAR:  S1, S2 was normal.  There was soft systolic murmur and S4 gallop. ABDOMEN:  Soft.  Bowel sounds were present.  Nontender. EXTREMITIES:  There was no clubbing, cyanosis, or edema.  LABORATORY DATA:  Sodium was 137, potassium 3.6, BUN 17, creatinine 1.10.  Repeat creatinine was 1.40.  His initial troponin-I was point of care 0.04, next set was 19.07.  Repeat troponins were 14.74, 4.63 and today is 2.0.  CK was 259, MB 12.1.  Repeat CK today is 147, MB 4.3. Post-PCI EKG showed normal sinus rhythm with septal Q-waves, marked improvement in ST elevation in inferolateral leads.  Almost resolution of ST elevation in inferolateral leads.  BRIEF HOSPITAL COURSE:  The patient was directly taken to the Cath Lab and underwent emergency left cath and  PTCA, stenting to apical LAD as per procedure report.  The patient tolerated the procedure well.  There were no complications.  Postprocedure, the patient did not have any anginal chest pain during the hospital stay.  The patient did have few episodes of nonsustained VT, asymptomatic.  His beta-blocker dose increased.  Phase 1 cardiac rehab was called.  The patient has been ambulating in room and hallway without any problems.  His groin is stable with no evidence of hematoma or bruit.  The patient will be discharged home on above medications and will be followed up in my office in 1 week.  The patient has been counseled  extensively regarding diet, lifestyle modification, and compliance with the medications.     Eduardo Osier. Sharyn Lull, M.D.     MNH/MEDQ  D:  05/24/2013  T:  05/25/2013  Job:  [id number]

## 2013-06-13 ENCOUNTER — Encounter: Payer: Self-pay | Admitting: Cardiology

## 2013-06-13 NOTE — CV Procedure (Signed)
Addendum to left cardiac cath/PTCA stent report for date 05/22/2013 dictated on 06/13/2013 dictation number is [id number]

## 2013-06-14 NOTE — Cardiovascular Report (Signed)
NAMEWOLF, BOULAY NO.:  [id number]  MEDICAL RECORD NO.:  [id number]  LOCATION:  2025                         FACILITY:  [facility]  PHYSICIAN:  Eduardo Osier. Sharyn Lull, M.D. DATE OF BIRTH:  Sep 10, 1956  DATE OF PROCEDURE:  05/22/2013 DATE OF DISCHARGE:  05/24/2013                           CARDIAC CATHETERIZATION   ADDENDUM:  Briefly, the patient is a 57 year old male with no significant past medical history except for hypercholesteremia, controlled by diet.  He drove to the ER, complaining of retrosternal chest pain, described as burning, pressure, grade 8/10, radiating to the left arm associated with nausea and diaphoresis.  He took initially antacids without relief, so he decided to come to the ER.  In the ER, EKG done showed normal sinus rhythm with ST elevation in inferolateral leads and reciprocal changes in lead aVL.  Also, poor R-wave progression in V1 to V3, suggestive of acute anterolateral injury pattern.  The patient had emergent cardiac catheterization which revealed 95% to 99% apical LAD stenosis which was supplying the inferior wall.  The patient did receive Brilinta in the ED and Angiomax and had established blood flow in the apical LAD which was very small vessel.  Successful PTCA and stenting was done to apical LAD with excellent results.  The patient was not felt as an emergency case as the patient already had distal flow in the LAD.  So, door-to-balloon time was out of range up to 93 minutes.     Eduardo Osier. Sharyn Lull, M.D.     MNH/MEDQ  D:  06/13/2013  T:  06/14/2013  Job:  [id number]

## 2013-07-19 ENCOUNTER — Encounter: Payer: BC Managed Care – PPO | Admitting: Cardiology

## 2013-07-26 ENCOUNTER — Encounter: Payer: Self-pay | Admitting: *Deleted

## 2013-07-26 ENCOUNTER — Encounter: Payer: Self-pay | Admitting: Cardiology

## 2013-07-26 DIAGNOSIS — I10 Essential (primary) hypertension: Secondary | ICD-10-CM | POA: Insufficient documentation

## 2013-07-26 DIAGNOSIS — N289 Disorder of kidney and ureter, unspecified: Secondary | ICD-10-CM | POA: Insufficient documentation

## 2013-07-26 DIAGNOSIS — E876 Hypokalemia: Secondary | ICD-10-CM | POA: Insufficient documentation

## 2013-07-27 ENCOUNTER — Ambulatory Visit (INDEPENDENT_AMBULATORY_CARE_PROVIDER_SITE_OTHER): Payer: BC Managed Care – PPO | Admitting: Cardiology

## 2013-07-27 ENCOUNTER — Encounter: Payer: Self-pay | Admitting: Cardiology

## 2013-07-27 VITALS — BP 154/90 | HR 80 | Ht 74.0 in | Wt 231.8 lb

## 2013-07-27 DIAGNOSIS — I2510 Atherosclerotic heart disease of native coronary artery without angina pectoris: Secondary | ICD-10-CM | POA: Insufficient documentation

## 2013-07-27 DIAGNOSIS — I10 Essential (primary) hypertension: Secondary | ICD-10-CM

## 2013-07-27 DIAGNOSIS — E785 Hyperlipidemia, unspecified: Secondary | ICD-10-CM

## 2013-07-27 MED ORDER — RAMIPRIL 10 MG PO CAPS: 10.0000 mg | ORAL_CAPSULE | Freq: Every day | ORAL | Status: DC

## 2013-07-27 MED ORDER — ATORVASTATIN CALCIUM 80 MG PO TABS: 80.0000 mg | ORAL_TABLET | Freq: Every day | ORAL | Status: DC

## 2013-07-27 MED ORDER — METOPROLOL SUCCINATE ER 50 MG PO TB24: 50.0000 mg | ORAL_TABLET | Freq: Every day | ORAL | Status: DC

## 2013-07-27 MED ORDER — TICAGRELOR 90 MG PO TABS: 90.0000 mg | ORAL_TABLET | Freq: Two times a day (BID) | ORAL | Status: DC

## 2013-07-27 NOTE — Assessment & Plan Note (Signed)
Patient was not taking his Altace and Toprol. He wasn't sure if he needed these medications. We will resume.

## 2013-07-27 NOTE — Progress Notes (Signed)
HPI: 57 year old male for evaluation of coronary artery disease. Patient was admitted to [facility] under the care of Dr. Sharyn Lull in August of 2014 with an acute inferior lateral myocardial infarction. Cardiac catheterization revealed showed an ejection fraction of 50-55%. There was a distal 95-99% LAD apical stenosis. The first diagonal had an 80-90% proximal lesion. A fourth marginal had a 40-50% lesion in the right coronary had a 30-40% lesion. The patient had PCI of the distal LAD with a drug-eluting stent. Since DC, no dyspnea, chest pain, palpitations or syncope.  Current Outpatient Prescriptions  Medication Sig Dispense Refill  . anastrozole (ARIMIDEX) 1 MG tablet Twice a week      . aspirin EC 81 MG EC tablet Take 1 tablet (81 mg total) by mouth daily.  30 tablet  3  . nitroGLYCERIN (NITROSTAT) 0.4 MG SL tablet Place 1 tablet (0.4 mg total) under the tongue every 5 (five) minutes x 3 doses as needed for chest pain.  25 tablet  12  . testosterone cypionate (DEPOTESTOTERONE CYPIONATE) 200 MG/ML injection       . Ticagrelor (BRILINTA) 90 MG TABS tablet Take 1 tablet (90 mg total) by mouth 2 (two) times daily.  60 tablet  11  . VYVANSE 50 MG capsule 50 mg. Once a day      . atorvastatin (LIPITOR) 80 MG tablet Take 1 tablet (80 mg total) by mouth daily at 6 PM.  30 tablet  3  . metoprolol succinate (TOPROL XL) 50 MG 24 hr tablet Take 1 tablet (50 mg total) by mouth daily. Take with or immediately following a meal.  30 tablet  3  . ramipril (ALTACE) 10 MG capsule Take 1 capsule (10 mg total) by mouth daily.  30 capsule  3   No current facility-administered medications for this visit.    No Known Allergies  Past Medical History  Diagnosis Date  . HTN (hypertension)   . CAD (coronary artery disease)   . Hyperlipidemia     Past Surgical History  Procedure Laterality Date  . Achilles tendon repair    . Tonsillectomy    . Wisdom tooth extraction      History   Social  History  . Marital Status: Single    Spouse Name: N/A    Number of Children: 4  . Years of Education: N/A   Occupational History  .     Social History Main Topics  . Smoking status: Never Smoker   . Smokeless tobacco: Not on file  . Alcohol Use: Yes     Comment: 3 days per week  . Drug Use: No  . Sexual Activity: Not on file   Other Topics Concern  . Not on file   Social History Narrative  . No narrative on file    Family History  Problem Relation Age of Onset  . Heart disease      No family history    ROS: no fevers or chills, productive cough, hemoptysis, dysphasia, odynophagia, melena, hematochezia, dysuria, hematuria, rash, seizure activity, orthopnea, PND, pedal edema, claudication. Remaining systems are negative.  Physical Exam:   Blood pressure 154/90, pulse 80, height 6\' 2"  (1.88 m), weight 231 lb 12 oz (105.121 kg).  General:  Well developed/well nourished in NAD Skin warm/dry Patient not depressed No peripheral clubbing Back-normal HEENT-normal/normal eyelids Neck supple/normal carotid upstroke bilaterally; no bruits; no JVD; no thyromegaly chest - CTA/ normal expansion CV - RRR/normal S1 and S2; no murmurs, rubs  or gallops;  PMI nondisplaced Abdomen -NT/ND, no HSM, no mass, + bowel sounds, no bruit 2+ femoral pulses, no bruits Ext-no edema, chords, 2+ DP Neuro-grossly nonfocal

## 2013-07-27 NOTE — Assessment & Plan Note (Signed)
Continue Lipitor 80 mg daily. Check lipids and liver.

## 2013-07-27 NOTE — Assessment & Plan Note (Signed)
Continue aspirin and brilinta; DC brilinta one year from MI.

## 2013-07-27 NOTE — Patient Instructions (Signed)
Your physician wants you to follow-up in: 6 MONTHS WITH DR Jens Som You will receive a reminder letter in the mail two months in advance. If you don't receive a letter, please call our office to schedule the follow-up appointment.   RESTART ALTACE 10 MG ONCE DAILY  RESTART METOPROLOL 50 MG ONCE DAILY

## 2013-11-13 ENCOUNTER — Telehealth: Payer: Self-pay | Admitting: Cardiology

## 2013-11-13 NOTE — Telephone Encounter (Signed)
Received request from Nurse fax box, documents faxed for surgical clearance. To: [facility]  Fax number: [phone] Attention: 1.27.15/kdm

## 2013-12-14 ENCOUNTER — Telehealth: Payer: Self-pay | Admitting: Cardiology

## 2013-12-14 NOTE — Telephone Encounter (Signed)
New problem   Pt need to speak to nurse concerning slight chest pain. Pt isn't having chest pain at present.

## 2013-12-14 NOTE — Telephone Encounter (Signed)
Spoke with pt, he has noticed for the last week a dull ache in his chest. The discomfort in felt after eating a large meal and usually at night when he is resting in the recliner. He is having no discomfort with exertion. He denies SOB or other symptoms. tums does help relieve the discomfort. This is nothing like the discomfort he had prior to stenting he had in august. Reassurance given to pt, does not sound heart related. He is going to try getting an OTC PPI to take on a daily basis to see if that helps relieve his symptoms. If he cont to have problems, he will call so he can be seen prior to his follow up appt. Pt agreed with this plan.

## 2014-01-01 ENCOUNTER — Encounter: Payer: Self-pay | Admitting: Cardiology

## 2014-01-02 ENCOUNTER — Telehealth: Payer: Self-pay | Admitting: Cardiology

## 2014-01-02 NOTE — Telephone Encounter (Signed)
New Message: ° °Pt is calling to hear recent test results. °

## 2014-01-02 NOTE — Telephone Encounter (Signed)
Spoke with pt, aware of lab results. 

## 2014-01-04 ENCOUNTER — Encounter: Payer: Self-pay | Admitting: Cardiology

## 2014-01-04 ENCOUNTER — Ambulatory Visit (INDEPENDENT_AMBULATORY_CARE_PROVIDER_SITE_OTHER): Payer: BC Managed Care – PPO | Admitting: Cardiology

## 2014-01-04 VITALS — BP 130/84 | HR 61 | Ht 74.0 in | Wt 243.0 lb

## 2014-01-04 DIAGNOSIS — I2510 Atherosclerotic heart disease of native coronary artery without angina pectoris: Secondary | ICD-10-CM

## 2014-01-04 DIAGNOSIS — E785 Hyperlipidemia, unspecified: Secondary | ICD-10-CM

## 2014-01-04 DIAGNOSIS — I10 Essential (primary) hypertension: Secondary | ICD-10-CM

## 2014-01-04 NOTE — Assessment & Plan Note (Signed)
Continue statin. Recent liver functions showed mild elevation of SGOT. Repeat LFTs in 12 weeks.

## 2014-01-04 NOTE — Progress Notes (Signed)
HPI: FU coronary artery disease. Patient was admitted to [facility] under the care of Dr. Sharyn LullHarwani in August of 2014 with an acute inferior lateral myocardial infarction. Cardiac catheterization showed an ejection fraction of 50-55%. There was a distal 95-99% LAD apical stenosis. The first diagonal had an 80-90% proximal lesion. A fourth marginal had a 40-50% lesion in the right coronary had a 30-40% lesion. The patient had PCI of the distal LAD with a drug-eluting stent. Since last seen in Oct 2014, the patient denies any dyspnea on exertion, orthopnea, PND, pedal edema, palpitations, syncope or chest pain.   Current Outpatient Prescriptions  Medication Sig Dispense Refill  . anastrozole (ARIMIDEX) 1 MG tablet Twice a week      . aspirin EC 81 MG EC tablet Take 1 tablet (81 mg total) by mouth daily.  30 tablet  3  . atorvastatin (LIPITOR) 80 MG tablet Take 1 tablet (80 mg total) by mouth daily at 6 PM.  90 tablet  3  . metoprolol succinate (TOPROL XL) 50 MG 24 hr tablet Take 1 tablet (50 mg total) by mouth daily. Take with or immediately following a meal.  90 tablet  3  . nitroGLYCERIN (NITROSTAT) 0.4 MG SL tablet Place 1 tablet (0.4 mg total) under the tongue every 5 (five) minutes x 3 doses as needed for chest pain.  25 tablet  12  . ramipril (ALTACE) 10 MG capsule Take 1 capsule (10 mg total) by mouth daily.  90 capsule  3  . testosterone cypionate (DEPOTESTOTERONE CYPIONATE) 200 MG/ML injection       . Ticagrelor (BRILINTA) 90 MG TABS tablet Take 1 tablet (90 mg total) by mouth 2 (two) times daily.  180 tablet  4  . triamcinolone cream (KENALOG) 0.1 %       . VIAGRA 100 MG tablet       . VYVANSE 50 MG capsule 50 mg. Once a day       No current facility-administered medications for this visit.     Past Medical History  Diagnosis Date  . HTN (hypertension)   . CAD (coronary artery disease)   . Hyperlipidemia     Past Surgical History  Procedure Laterality Date  .  Achilles tendon repair    . Tonsillectomy    . Wisdom tooth extraction      History   Social History  . Marital Status: Single    Spouse Name: N/A    Number of Children: 4  . Years of Education: N/A   Occupational History  .     Social History Main Topics  . Smoking status: Never Smoker   . Smokeless tobacco: Not on file  . Alcohol Use: Yes     Comment: 3 days per week  . Drug Use: No  . Sexual Activity: Not on file   Other Topics Concern  . Not on file   Social History Narrative  . No narrative on file    ROS: no fevers or chills, productive cough, hemoptysis, dysphasia, odynophagia, melena, hematochezia, dysuria, hematuria, rash, seizure activity, orthopnea, PND, pedal edema, claudication. Remaining systems are negative.  Physical Exam: Well-developed well-nourished in no acute distress.  Skin is warm and dry.  HEENT is normal.  Neck is supple.  Chest is clear to auscultation with normal expansion.  Cardiovascular exam is regular rate and rhythm.  Abdominal exam nontender or distended. No masses palpated. Extremities show no edema. neuro grossly intact  ECG sinus  rhythm at a rate of 61. Cannot rule out prior septal infarct. Nonspecific ST changes.

## 2014-01-04 NOTE — Assessment & Plan Note (Signed)
Blood pressure controlled.continue present medications. 

## 2014-01-04 NOTE — Patient Instructions (Signed)
Your physician wants you to follow-up in: 6 MONTHS WITH DR [facility]W [facility] You will receive a reminder letter in the mail two months in advance. If you don't receive a letter, please call our office to schedule the follow-up appointment.   Your physician recommends that you return for lab work in: 12 WEEKS

## 2014-01-04 NOTE — Assessment & Plan Note (Signed)
Continue aspirin, statin. Continue brilinta and discontinue one year from the time of his previous PCI.

## 2014-03-25 ENCOUNTER — Ambulatory Visit (HOSPITAL_BASED_OUTPATIENT_CLINIC_OR_DEPARTMENT_OTHER): Payer: BC Managed Care – PPO | Attending: Internal Medicine | Admitting: Radiology

## 2014-03-25 VITALS — Ht 75.0 in

## 2014-03-25 DIAGNOSIS — R0683 Snoring: Secondary | ICD-10-CM

## 2014-03-25 DIAGNOSIS — G4733 Obstructive sleep apnea (adult) (pediatric): Secondary | ICD-10-CM | POA: Insufficient documentation

## 2014-03-25 DIAGNOSIS — G4737 Central sleep apnea in conditions classified elsewhere: Secondary | ICD-10-CM | POA: Diagnosis not present

## 2014-03-25 DIAGNOSIS — G4700 Insomnia, unspecified: Secondary | ICD-10-CM | POA: Diagnosis present

## 2014-03-27 ENCOUNTER — Other Ambulatory Visit: Payer: Self-pay | Admitting: *Deleted

## 2014-03-27 DIAGNOSIS — I10 Essential (primary) hypertension: Secondary | ICD-10-CM

## 2014-03-27 DIAGNOSIS — E785 Hyperlipidemia, unspecified: Secondary | ICD-10-CM

## 2014-03-27 MED ORDER — TICAGRELOR 90 MG PO TABS: 90.0000 mg | ORAL_TABLET | Freq: Two times a day (BID) | ORAL | Status: DC

## 2014-03-30 DIAGNOSIS — G4733 Obstructive sleep apnea (adult) (pediatric): Secondary | ICD-10-CM

## 2014-03-30 NOTE — Sleep Study (Signed)
    NAME: Donald PostDaniel Pangle DATE OF BIRTH:  April 19, 1956 MEDICAL RECORD NUMBER [id number]  LOCATION: [facility]  PHYSICIAN: Demarkis Gheen D  DATE OF STUDY: 03/25/2014  SLEEP STUDY TYPE: Out of Center Sleep Test                REFERRING PHYSICIAN: Elisabeth MostStevenson, Amy, DO  INDICATION FOR STUDY: Insomnia with sleep apnea  EPWORTH SLEEPINESS SCORE:   HEIGHT: 6\' 3"  (190.5 cm)  WEIGHT:  (225#)    Body mass index is 0.00 kg/(m^2).  NECK SIZE: 19 in.  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 285 total events scored including 40 central apneas, 19 obstructive apneas, 16 mixed apneas, 210 hypopneas. Most events were with supine sleep position. Snoring with oxygen desaturation to a nadir of 81% and mean oxygen saturation through the study of 94% on room air. Mean heart rate 74.3 per minute.  RECOMMENDATION:  Scores in this range are usually addressed first with CPAP. Central apneas may not respond to CPAP, but the central component can be reassessed after the obstructive component is managed. This patient can be referred to the  Sleep Disorders Center for a dedicated CPAP titration study, or referred for sleep medicine consultation as needed.  Signed Jetty Duhamellinton Shaquana Buel M.D. Waymon BudgeYOUNG,Kesley Gaffey D Diplomate, American Board of Sleep Medicine  ELECTRONICALLY SIGNED ON:  03/30/2014, 9:51 AM [facility] PH: [phone]   FX: [phone] ACCREDITED BY THE AMERICAN ACADEMY OF SLEEP MEDICINE

## 2014-04-02 ENCOUNTER — Ambulatory Visit (HOSPITAL_BASED_OUTPATIENT_CLINIC_OR_DEPARTMENT_OTHER): Payer: BC Managed Care – PPO | Attending: Internal Medicine

## 2014-04-02 VITALS — Ht 75.0 in | Wt 230.0 lb

## 2014-04-02 DIAGNOSIS — G4710 Hypersomnia, unspecified: Secondary | ICD-10-CM | POA: Diagnosis present

## 2014-04-02 DIAGNOSIS — G4733 Obstructive sleep apnea (adult) (pediatric): Secondary | ICD-10-CM

## 2014-04-02 DIAGNOSIS — Z9989 Dependence on other enabling machines and devices: Secondary | ICD-10-CM

## 2014-04-02 DIAGNOSIS — G4730 Sleep apnea, unspecified: Principal | ICD-10-CM

## 2014-04-03 ENCOUNTER — Ambulatory Visit (HOSPITAL_BASED_OUTPATIENT_CLINIC_OR_DEPARTMENT_OTHER): Payer: BC Managed Care – PPO

## 2014-04-06 DIAGNOSIS — G4733 Obstructive sleep apnea (adult) (pediatric): Secondary | ICD-10-CM

## 2014-04-06 DIAGNOSIS — R0989 Other specified symptoms and signs involving the circulatory and respiratory systems: Secondary | ICD-10-CM

## 2014-04-06 DIAGNOSIS — R0609 Other forms of dyspnea: Secondary | ICD-10-CM

## 2014-04-06 NOTE — Sleep Study (Signed)
   NAME: Donald PostDaniel Dickmann DATE OF BIRTH:  [birth year] MEDICAL RECORD NUMBER [id number]  LOCATION: [facility]  PHYSICIAN: YOUNG,CLINTON D  DATE OF STUDY: 04/02/2014  SLEEP STUDY TYPE: Nocturnal Polysomnogram               REFERRING PHYSICIAN: Elisabeth MostStevenson, Amy, DO  INDICATION FOR STUDY: Hypersomnia with sleep apnea-CPAP titration  EPWORTH SLEEPINESS SCORE:   16/24 HEIGHT: 6\' 3"  (190.5 cm)  WEIGHT: 230 lb (104.327 kg)    Body mass index is 28.75 kg/(m^2).  NECK SIZE: 19 in.  MEDICATIONS: Charted for review  SLEEP ARCHITECTURE: Total sleep time 306.5 minutes with sleep efficiency 83.7%. Stage I was 13.2%, stage II 63.1%, stage III absent, REM 23.7% of total sleep time. Sleep latency 15.5 minutes, REM latency 39 minutes, awake after sleep onset 44 minutes, arousal index 4.5, bedtime medication: None  RESPIRATORY DATA: CPAP titration protocol. CPAP was titrated to 8 CWP, AHI 1.6 per hour. He wore a medium [employer] Simplus fullface mask, EPR 2, heated humidifier.  OXYGEN DATA: Snoring was prevented at final CPAP pressure, with mean oxygen saturation held at 94.8% on room air  CARDIAC DATA: Normal sinus rhythm  MOVEMENT/PARASOMNIA: 239 limb jerks were counted of which 5 were associated with arousals or awakenings for a periodic limb movement with arousal index of 1 per hour. Bathroom x1  IMPRESSION/ RECOMMENDATION:   1) Successful CPAP titration to 8 CWP, AHI 1.6 per hour. He wore a medium [employer] Simplus fullface mask, EPR 2, heated humidifier. Snoring was prevented and mean oxygen saturation held at 94.8% on room air. 2) Baseline polysomnogram 03/25/2014-unattended home study-AHI 47.7 per hour with central and obstructive events. Body weight 225 pounds for that study.   Signed Jetty Duhamellinton Young M.D. Waymon BudgeYOUNG,CLINTON D Diplomate, American Board of Sleep Medicine  ELECTRONICALLY SIGNED ON:  04/06/2014, 10:58 AM [facility] PH: [phone]  [phone]   FX: [phone] ACCREDITED BY THE AMERICAN ACADEMY OF SLEEP MEDICINE

## 2014-04-23 ENCOUNTER — Encounter: Payer: Self-pay | Admitting: Pulmonary Disease

## 2014-04-23 ENCOUNTER — Ambulatory Visit (INDEPENDENT_AMBULATORY_CARE_PROVIDER_SITE_OTHER): Payer: BC Managed Care – PPO | Admitting: Pulmonary Disease

## 2014-04-23 VITALS — BP 154/80 | HR 71 | Temp 98.1°F | Ht 75.0 in | Wt 242.6 lb

## 2014-04-23 DIAGNOSIS — G4733 Obstructive sleep apnea (adult) (pediatric): Secondary | ICD-10-CM

## 2014-04-23 MED ORDER — ZOLPIDEM TARTRATE 10 MG PO TABS: 10.0000 mg | ORAL_TABLET | Freq: Every evening | ORAL | Status: DC | PRN

## 2014-04-23 NOTE — Assessment & Plan Note (Addendum)
Central  And obstructive apneas were controlled on CPAP We will set you up with a CPAP machine at 8cm pressure, medium full face mask. Set up a ramp time of 15 minutes and had humidity Ambien 10 mg if needed 30 mins prior to bedtime #10 ,in case he has insomnia or anxiety related to full face mask In the future we can trial of nasal mask and given nasal pillows CPAP download will be obtained in a month's time Call for issues

## 2014-04-23 NOTE — Progress Notes (Signed)
Subjective:    Patient ID: Donald Calhoun, male    DOB: [birth year], 58 y.o.   MRN: [id number]  HPI  PCP- Amy stevenson 58 year old man presents for management of obstructive sleep apnea. pmh- includes coronary artery disease status post stent in 2014, ADHD and hypertension. He reports excessive daytime somnolence and loud snoring has been noted by fiance. He has tried oral appliance to decrease snoring but this does not work when he sleeps on his back. Epworth Sleepiness score was 16. Bedtime is around 11 PM, he drinks 2 glasses of wine daily last drink by 8 PM, there is a TV in the bedroom, he sleeps on his side with one pillow, reports 10-15 nocturnal arousals with at least 1 bathroom visit without any post void sleep latency and is out of bed by 7 AM feeling tired, with an occasional headache that subsides by 10 AM. He drinks 2 cups of coffee in the morning. Baseline polysomnogram in 03/2014 with weight of 225 pounds, BMI 28, neck size 19 inches showed an AHI of 48 per hour with 40 central apneas and lowest desaturation to 81% of. Due to presence of central apneas, CPAP titration was performed at the lab on a subsequent study and events were controlled with CPAP of 8 cm with a medium fullface mask. Snoring was prevented in oxygen saturation stayed at 95%. He reports feeling refreshed the morning after even though her sleep time was only 5 hours.  He has his own company, [employer] and is a little frustrated that he has not been started on CPAP already.  There is no history suggestive of cataplexy, sleep paralysis or parasomnias ADHD symptoms are controlled on Vyvanse daily a.m.  Past Medical History  Diagnosis Date  . HTN (hypertension)   . CAD (coronary artery disease)   . Hyperlipidemia     Past Surgical History  Procedure Laterality Date  . Achilles tendon repair    . Tonsillectomy    . Wisdom tooth extraction      No Known Allergies  History   Social History  . Marital  Status: Single    Spouse Name: N/A    Number of Children: 4  . Years of Education: N/A   Occupational History  .     Social History Main Topics  . Smoking status: Never Smoker   . Smokeless tobacco: Not on file  . Alcohol Use: Yes  . Drug Use: No  . Sexual Activity: Not on file   Other Topics Concern  . Not on file   Social History Narrative  . No narrative on file    Family History  Problem Relation Age of Onset  . Heart disease      No family history       Review of Systems  Constitutional: Negative for fever and unexpected weight change.  HENT: Negative for congestion, dental problem, ear pain, nosebleeds, postnasal drip, rhinorrhea, sinus pressure, sneezing, sore throat and trouble swallowing.   Eyes: Negative for redness and itching.  Respiratory: Positive for cough. Negative for chest tightness, shortness of breath and wheezing.   Cardiovascular: Negative for palpitations and leg swelling.  Gastrointestinal: Negative for nausea and vomiting.  Genitourinary: Negative for dysuria.  Musculoskeletal: Negative for joint swelling.  Skin: Negative for rash.  Neurological: Positive for headaches.  Hematological: Does not bruise/bleed easily.  Psychiatric/Behavioral: Negative for dysphoric mood. The patient is not nervous/anxious.        Objective:   Physical Exam  Gen. Pleasant,  well-built, in no distress, normal affect ENT - no lesions, no post nasal drip, class 2 airway Neck: No JVD, no thyromegaly, no carotid bruits Lungs: no use of accessory muscles, no dullness to percussion, decreased without rales or rhonchi  Cardiovascular: Rhythm regular, heart sounds  normal, no murmurs or gallops, no peripheral edema Abdomen: soft and non-tender, no hepatosplenomegaly, BS normal. Musculoskeletal: No deformities, no cyanosis or clubbing Neuro:  alert, non focal, no tremors       Assessment & Plan:

## 2014-04-23 NOTE — Patient Instructions (Signed)
We will set you up with a CPAP machine at 8cm pressure, medium full face mask Ambien 10 mg if needed 30 mins prior to bedtime #10 Call for issues

## 2014-05-06 ENCOUNTER — Telehealth: Payer: Self-pay | Admitting: Cardiology

## 2014-05-06 NOTE — Telephone Encounter (Signed)
Will forward for dr crenshaw review  

## 2014-05-06 NOTE — Telephone Encounter (Signed)
Left message for pt to call  Left message for courtney that the procedure can be scheduled after 05-22-14. The pt will be able to stop the brilinta on that day.

## 2014-05-06 NOTE — Telephone Encounter (Signed)
Dc brilinta and continue asa Donald MillersBrian Calhoun

## 2014-05-06 NOTE — Telephone Encounter (Signed)
New message     Need clearance for pt to have a colonoscopy in august.  Can he hold brilinta prior to procedure?

## 2014-05-07 ENCOUNTER — Institutional Professional Consult (permissible substitution): Payer: BC Managed Care – PPO | Admitting: Pulmonary Disease

## 2014-05-07 NOTE — Telephone Encounter (Signed)
Spoke with pt, Aware of dr crenshaw's recommendations.  °

## 2014-05-07 NOTE — Telephone Encounter (Signed)
Returned call    Please call

## 2014-06-12 ENCOUNTER — Ambulatory Visit: Payer: BC Managed Care – PPO | Admitting: Pulmonary Disease

## 2014-06-13 ENCOUNTER — Ambulatory Visit (INDEPENDENT_AMBULATORY_CARE_PROVIDER_SITE_OTHER): Payer: BC Managed Care – PPO | Admitting: Pulmonary Disease

## 2014-06-13 ENCOUNTER — Encounter: Payer: Self-pay | Admitting: Pulmonary Disease

## 2014-06-13 ENCOUNTER — Encounter (INDEPENDENT_AMBULATORY_CARE_PROVIDER_SITE_OTHER): Payer: Self-pay

## 2014-06-13 VITALS — BP 140/86 | HR 70 | Temp 98.0°F | Ht 75.0 in | Wt 237.0 lb

## 2014-06-13 DIAGNOSIS — G4733 Obstructive sleep apnea (adult) (pediatric): Secondary | ICD-10-CM

## 2014-06-13 NOTE — Assessment & Plan Note (Signed)
He would like to try nasal mask & that is ok - if mouth leak, can use with chin strap. He wil call back to report. Otherwise, much improved on cpap 8 cm We discussed care of machine  Weight loss encouraged, compliance with goal of at least 4-6 hrs every night is the expectation. Advised against medications with sedative side effects Cautioned against driving when sleepy - understanding that sleepiness will vary on a day to day basis

## 2014-06-13 NOTE — Progress Notes (Signed)
   Subjective:    Patient ID: Donald Calhoun, male    DOB: Mar 01, 1956, 58 y.o.   MRN: [id number]  HPI  PCP- Amy stevenson   58 year old man presents for FU of obstructive sleep apnea.  pmh- includes coronary artery disease status post stent in 2014, ADHD and hypertension.  He had tried oral appliance.  Baseline polysomnogram in 03/2014 with weight of 225 pounds, BMI 28, neck size 19 inches showed an AHI of 48 per hour with 40 central apneas and lowest desaturation to 81% of.  Events were controlled with CPAP of 8 cm with a medium fullface mask.  He has his own company, [employer] . ADHD symptoms are controlled on Vyvanse daily a.m.  05/2014 download on 8 cm >> no residuals, leak ok, good compliance> 6h He feels better rested, turned up humidity due to dryness, no nasal stuffiness. Wife reports no snoring or naps. He takes cpap with him when he travels    Review of Systems neg for any significant sore throat, dysphagia, itching, sneezing, nasal congestion or excess/ purulent secretions, fever, chills, sweats, unintended wt loss, pleuritic or exertional cp, hempoptysis, orthopnea pnd or change in chronic leg swelling. Also denies presyncope, palpitations, heartburn, abdominal pain, nausea, vomiting, diarrhea or change in bowel or urinary habits, dysuria,hematuria, rash, arthralgias, visual complaints, headache, numbness weakness or ataxia.     Objective:   Physical Exam  Gen. Pleasant, obese, in no distress ENT - no lesions, no post nasal drip Neck: No JVD, no thyromegaly, no carotid bruits Lungs: no use of accessory muscles, no dullness to percussion, decreased without rales or rhonchi  Cardiovascular: Rhythm regular, heart sounds  normal, no murmurs or gallops, no peripheral edema Musculoskeletal: No deformities, no cyanosis or clubbing , no tremors       Assessment & Plan:

## 2014-06-13 NOTE — Patient Instructions (Signed)
Your cpap is set at 8 cm Trial of nasal mask Call us as needed

## 2014-07-08 ENCOUNTER — Ambulatory Visit (INDEPENDENT_AMBULATORY_CARE_PROVIDER_SITE_OTHER): Payer: BC Managed Care – PPO | Admitting: Cardiology

## 2014-07-08 ENCOUNTER — Encounter: Payer: Self-pay | Admitting: Cardiology

## 2014-07-08 VITALS — BP 138/100 | HR 69 | Ht 75.0 in | Wt 246.0 lb

## 2014-07-08 DIAGNOSIS — I2510 Atherosclerotic heart disease of native coronary artery without angina pectoris: Secondary | ICD-10-CM

## 2014-07-08 MED ORDER — LISINOPRIL 20 MG PO TABS: 20.0000 mg | ORAL_TABLET | Freq: Every day | ORAL | Status: DC

## 2014-07-08 MED ORDER — ATORVASTATIN CALCIUM 40 MG PO TABS: 40.0000 mg | ORAL_TABLET | Freq: Every day | ORAL | Status: DC

## 2014-07-08 NOTE — Assessment & Plan Note (Signed)
Will continue aspirin and resume statin.

## 2014-07-08 NOTE — Patient Instructions (Signed)
Your physician wants you to follow-up in: ONE YEAR WITH DR Shelda Pal will receive a reminder letter in the mail two months in advance. If you don't receive a letter, please call our office to schedule the follow-up appointment.   START ATORVASTATIN 40 MG ONCE DAILY  START LISINOPRIL 20 MG ONCE DAILY  Your physician recommends that you return for lab work in: 4 WEEKS= DO NOT EAT PRIOR TO LAB WORK

## 2014-07-08 NOTE — Assessment & Plan Note (Signed)
Resume lipitor 40 mg daily and check lipids and liver in six weeks

## 2014-07-08 NOTE — Progress Notes (Signed)
      HPI: FU coronary artery disease. Patient was admitted to [facility] under the care of Dr. Sharyn Lull in August of 2014 with an acute inferior lateral myocardial infarction. Cardiac catheterization showed an ejection fraction of 50-55%. There was a distal 95-99% LAD apical stenosis. The first diagonal had an 80-90% proximal lesion. A fourth marginal had a 40-50% lesion in the right coronary had a 30-40% lesion. The patient had PCI of the distal LAD with a drug-eluting stent. Since last seen, the patient denies any dyspnea on exertion, orthopnea, PND, pedal edema, palpitations, syncope or chest pain.   Current Outpatient Prescriptions  Medication Sig Dispense Refill  . amoxicillin (AMOXIL) 500 MG capsule Take 500 mg by mouth 3 (three) times daily.      Marland Kitchen anastrozole (ARIMIDEX) 1 MG tablet Twice a week      . aspirin EC 81 MG EC tablet Take 1 tablet (81 mg total) by mouth daily.  30 tablet  3  . ibuprofen (ADVIL,MOTRIN) 800 MG tablet Take 800 mg by mouth every 6 (six) hours as needed.      . testosterone cypionate (DEPOTESTOTERONE CYPIONATE) 200 MG/ML injection Once a week      . triamcinolone cream (KENALOG) 0.1 % as needed.       Marland Kitchen VIAGRA 100 MG tablet as needed.       Marland Kitchen VYVANSE 50 MG capsule 50 mg. Once a day       No current facility-administered medications for this visit.     Past Medical History  Diagnosis Date  . HTN (hypertension)   . CAD (coronary artery disease)   . Hyperlipidemia     Past Surgical History  Procedure Laterality Date  . Achilles tendon repair    . Tonsillectomy    . Wisdom tooth extraction      History   Social History  . Marital Status: Single    Spouse Name: N/A    Number of Children: 4  . Years of Education: N/A   Occupational History  .     Social History Main Topics  . Smoking status: Never Smoker   . Smokeless tobacco: Not on file  . Alcohol Use: Yes  . Drug Use: No  . Sexual Activity: Not on file   Other Topics Concern    . Not on file   Social History Narrative  . No narrative on file    ROS: no fevers or chills, productive cough, hemoptysis, dysphasia, odynophagia, melena, hematochezia, dysuria, hematuria, rash, seizure activity, orthopnea, PND, pedal edema, claudication. Remaining systems are negative.  Physical Exam: Well-developed well-nourished in no acute distress.  Skin is warm and dry.  HEENT is normal.  Neck is supple.  Chest is clear to auscultation with normal expansion.  Cardiovascular exam is regular rate and rhythm.  Abdominal exam nontender or distended. No masses palpated. Extremities show no edema. neuro grossly intact  ECG Sinus rhythm, Cannot rule out prior septal infarct, inferior T-wave inversion.

## 2014-07-08 NOTE — Assessment & Plan Note (Signed)
Blood pressure is elevated. Add lisinopril 20 mg daily. Check potassium and renal function in 4 weeks.

## 2014-09-26 ENCOUNTER — Encounter (HOSPITAL_COMMUNITY): Payer: Self-pay | Admitting: Cardiology

## 2015-04-22 ENCOUNTER — Emergency Department (HOSPITAL_COMMUNITY): Payer: BLUE CROSS/BLUE SHIELD

## 2015-04-22 ENCOUNTER — Encounter (HOSPITAL_COMMUNITY): Payer: Self-pay | Admitting: Emergency Medicine

## 2015-04-22 ENCOUNTER — Emergency Department (HOSPITAL_COMMUNITY)
Admission: EM | Admit: 2015-04-22 | Discharge: 2015-04-22 | Disposition: A | Discharge status: Home / Self Care | Payer: BLUE CROSS/BLUE SHIELD | Attending: Emergency Medicine | Admitting: Emergency Medicine

## 2015-04-22 DIAGNOSIS — E785 Hyperlipidemia, unspecified: Secondary | ICD-10-CM | POA: Insufficient documentation

## 2015-04-22 DIAGNOSIS — Z7982 Long term (current) use of aspirin: Secondary | ICD-10-CM | POA: Insufficient documentation

## 2015-04-22 DIAGNOSIS — Z79899 Other long term (current) drug therapy: Secondary | ICD-10-CM | POA: Diagnosis not present

## 2015-04-22 DIAGNOSIS — I252 Old myocardial infarction: Secondary | ICD-10-CM | POA: Diagnosis not present

## 2015-04-22 DIAGNOSIS — R110 Nausea: Secondary | ICD-10-CM | POA: Insufficient documentation

## 2015-04-22 DIAGNOSIS — R12 Heartburn: Secondary | ICD-10-CM | POA: Insufficient documentation

## 2015-04-22 DIAGNOSIS — R079 Chest pain, unspecified: Secondary | ICD-10-CM | POA: Insufficient documentation

## 2015-04-22 DIAGNOSIS — I10 Essential (primary) hypertension: Secondary | ICD-10-CM | POA: Insufficient documentation

## 2015-04-22 DIAGNOSIS — I2510 Atherosclerotic heart disease of native coronary artery without angina pectoris: Secondary | ICD-10-CM | POA: Diagnosis not present

## 2015-04-22 HISTORY — DX: Acute myocardial infarction, unspecified: I21.9

## 2015-04-22 LAB — BRAIN NATRIURETIC PEPTIDE: B NATRIURETIC PEPTIDE 5: 10.9 pg/mL (ref 0.0–100.0)

## 2015-04-22 LAB — BASIC METABOLIC PANEL
Anion gap: 7 (ref 5–15)
BUN: 11 mg/dL (ref 6–20)
CALCIUM: 8.9 mg/dL (ref 8.9–10.3)
CO2: 28 mmol/L (ref 22–32)
CREATININE: 1.46 mg/dL — AB (ref 0.61–1.24)
Chloride: 103 mmol/L (ref 101–111)
GFR calc Af Amer: 60 mL/min — ABNORMAL LOW (ref >60)
GFR, EST NON AFRICAN AMERICAN: 52 mL/min — AB (ref >60)
GLUCOSE: 157 mg/dL — AB (ref 65–99)
Potassium: 3.6 mmol/L (ref 3.5–5.1)
Sodium: 138 mmol/L (ref 135–145)

## 2015-04-22 LAB — I-STAT TROPONIN, ED
TROPONIN I, POC: 0 ng/mL (ref 0.00–0.08)
Troponin i, poc: 0 ng/mL (ref 0.00–0.08)

## 2015-04-22 LAB — CBC
HCT: 47.1 % (ref 39.0–52.0)
Hemoglobin: 16.5 g/dL (ref 13.0–17.0)
MCH: 33.2 pg (ref 26.0–34.0)
MCHC: 35 g/dL (ref 30.0–36.0)
MCV: 94.8 fL (ref 78.0–100.0)
Platelets: 232 10*3/uL (ref 150–400)
RBC: 4.97 MIL/uL (ref 4.22–5.81)
RDW: 13.1 % (ref 11.5–15.5)
WBC: 7.3 10*3/uL (ref 4.0–10.5)

## 2015-04-22 MED ORDER — ASPIRIN 81 MG PO CHEW
324.0000 mg | CHEWABLE_TABLET | Freq: Once | ORAL | Status: AC
  Administered 2015-04-22: 324 mg via ORAL
  Filled 2015-04-22: qty 4

## 2015-04-22 MED ORDER — GI COCKTAIL ~~LOC~~
30.0000 mL | Freq: Once | ORAL | Status: AC
  Administered 2015-04-22: 30 mL via ORAL
  Filled 2015-04-22: qty 30

## 2015-04-22 MED ORDER — SODIUM CHLORIDE 0.9 % IV BOLUS (SEPSIS): 1000.0000 mL | Freq: Once | INTRAVENOUS | Status: DC

## 2015-04-22 NOTE — ED Notes (Signed)
Pt states that he is chest pain free at this time.

## 2015-04-22 NOTE — ED Notes (Signed)
Pt was told that his troponin level was negative and would be discharged shortly. Checked on other patients, and came back to find that Mr Donald Calhoun had left the room.

## 2015-04-22 NOTE — ED Notes (Signed)
Pt refusing IV and does not want fluids. MD made aware and told this RN to encourage PO fluid intake.

## 2015-04-22 NOTE — ED Provider Notes (Signed)
CSN: [id number]     Arrival date & time 04/22/15  1724 History   First MD Initiated Contact with Patient 04/22/15 1748     Chief Complaint  Patient presents with  . Chest Pain    (Consider location/radiation/quality/duration/timing/severity/associated sxs/prior Treatment) Patient is a 59 y.o. male presenting with chest pain. The history is provided by the patient.  Chest Pain Pain location:  Substernal area Pain quality: burning and hot   Pain radiates to:  Does not radiate Pain radiates to the back: no   Pain severity:  Moderate Onset quality:  Sudden Timing:  Intermittent Progression:  Partially resolved Chronicity:  New Context: movement   Relieved by:  Nothing Worsened by:  Nothing tried Ineffective treatments:  Nitroglycerin Associated symptoms: heartburn and nausea   Associated symptoms: no abdominal pain, no altered mental status, no anxiety, no back pain, no claudication, no cough, no diaphoresis, no fatigue, no fever, no headache, no palpitations, no shortness of breath, not vomiting and no weakness   Risk factors: coronary artery disease, high cholesterol, hypertension, male sex and smoking     Past Medical History  Diagnosis Date  . HTN (hypertension)   . CAD (coronary artery disease)   . Hyperlipidemia   . MI (myocardial infarction) 2014   Past Surgical History  Procedure Laterality Date  . Achilles tendon repair    . Tonsillectomy    . Wisdom tooth extraction    . Left heart catheterization with coronary angiogram N/A 05/22/2013    Procedure: LEFT HEART CATHETERIZATION WITH CORONARY ANGIOGRAM;  Surgeon: Robynn PaneMohan N Harwani, MD;  [facility];  Service: Cardiovascular;  Laterality: N/A;   Family History  Problem Relation Age of Onset  . Heart disease      No family history   History  Substance Use Topics  . Smoking status: Never Smoker   . Smokeless tobacco: Not on file  . Alcohol Use: Yes    Review of Systems  Constitutional: Negative for fever,  diaphoresis and fatigue.  Respiratory: Negative for cough, chest tightness and shortness of breath.   Cardiovascular: Positive for chest pain. Negative for palpitations and claudication.  Gastrointestinal: Positive for heartburn and nausea. Negative for vomiting and abdominal pain.  Musculoskeletal: Negative for back pain.  Neurological: Negative for weakness, light-headedness and headaches.  Psychiatric/Behavioral: Negative for confusion.  All other systems reviewed and are negative.     Allergies  Review of patient's allergies indicates no known allergies.  Home Medications   Prior to Admission medications   Medication Sig Start Date End Date Taking? Authorizing Provider  aspirin EC 81 MG EC tablet Take 1 tablet (81 mg total) by mouth daily. 05/24/13  Yes Rinaldo CloudMohan Harwani, MD  atorvastatin (LIPITOR) 40 MG tablet Take 1 tablet (40 mg total) by mouth daily. 07/08/14  Yes Lewayne BuntingBrian S Crenshaw, MD  lisinopril (PRINIVIL,ZESTRIL) 20 MG tablet Take 1 tablet (20 mg total) by mouth daily. 07/08/14  Yes Lewayne BuntingBrian S Crenshaw, MD  Multiple Vitamin (MULTIVITAMIN) tablet Take 1 tablet by mouth daily.   Yes Historical Provider, MD  Polyethyl Glycol-Propyl Glycol (SYSTANE OP) Place 1 drop into both eyes daily.   Yes Historical Provider, MD  tamsulosin (FLOMAX) 0.4 MG CAPS capsule Take 0.4 mg by mouth daily. 03/30/15  Yes Historical Provider, MD  testosterone cypionate (DEPOTESTOTERONE CYPIONATE) 200 MG/ML injection Inject 150 mg into the muscle once a week.  07/13/13  Yes Historical Provider, MD  vitamin E 1000 UNIT capsule Take 1,000 Units by mouth daily.  Yes Historical Provider, MD  VYVANSE 50 MG capsule Take 100 mg by mouth daily. 03/31/15  Yes Historical Provider, MD   BP 143/89 mmHg  Pulse 91  Temp(Src) 98.3 F (36.8 C) (Oral)  Resp 16  Ht  (1.88 m)  Wt 235 lb (106.595 kg)  BMI 30.16 kg/m2  SpO2 96% Physical Exam  Constitutional: He is oriented to person, place, and time. He appears well-developed  and well-nourished. No distress.  HENT:  Head: Normocephalic and atraumatic.  Nose: Nose normal.  Mouth/Throat: Oropharynx is clear and moist. No oropharyngeal exudate.  Eyes: EOM are normal. Pupils are equal, round, and reactive to light.  Neck: Normal range of motion. Neck supple.  Cardiovascular: Normal rate, regular rhythm, normal heart sounds and intact distal pulses.   No murmur heard. Pulmonary/Chest: Effort normal and breath sounds normal. No respiratory distress. He has no wheezes. He exhibits no tenderness.  No reproducible chest tenderness to palpation.  Lungs clear, bilateral breath sounds  Abdominal: Soft. He exhibits no distension. There is no tenderness. There is no guarding.  Musculoskeletal: Normal range of motion. He exhibits no tenderness.  Neurological: He is alert and oriented to person, place, and time. No cranial nerve deficit. Coordination normal.  Skin: Skin is warm and dry. He is not diaphoretic. No pallor.  Psychiatric: He has a normal mood and affect. His behavior is normal. Judgment and thought content normal.  Nursing note and vitals reviewed.   ED Course  Procedures (including critical care time) Labs Review Labs Reviewed  BASIC METABOLIC PANEL - Abnormal; Notable for the following:    Glucose, Bld 157 (*)    Creatinine, Ser 1.46 (*)    GFR calc non Af Amer 52 (*)    GFR calc Af Amer 60 (*)    All other components within normal limits  CBC  BRAIN NATRIURETIC PEPTIDE  I-STAT TROPOININ, ED  I-STAT TROPOININ, ED  I-STAT TROPOININ, ED    Imaging Review Dg Chest 2 View  04/22/2015   CLINICAL DATA:  Pt had an odd feeling in his chest today, kind of like heart burn, but more intense.Hx of Myocardial infarction, HTN- on meds, High cholesterol- on meds, CAD, non smoker  EXAM: CHEST  2 VIEW  COMPARISON:  None.  FINDINGS: The heart size and mediastinal contours are within normal limits. Both lungs are clear. No pleural effusion or pneumothorax. The visualized  skeletal structures are unremarkable.  IMPRESSION: No active cardiopulmonary disease.   Electronically Signed   By: Amie Portland M.D.   On: 04/22/2015 18:37     EKG Interpretation   Date/Time:  Tuesday April 22 2015 17:28:28 EDT Ventricular Rate:  94 PR Interval:  158 QRS Duration: 90 QT Interval:  332 QTC Calculation: 415 R Axis:   96 Text Interpretation:  Normal sinus rhythm Rightward axis Nonspecific T  wave abnormality No significant change since last tracing Confirmed by  Denton Lank  MD, Caryn Bee ([id number]) on 04/22/2015 7:22:33 PM      MDM   Final diagnoses:  Chest pain, unspecified chest pain type  Heartburn    Pt is a 59 yo M with hx of HTN, CAD, HLD, who presents with chest pain.  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  Reports he was lifting a 50# bag, but usually that would not be a problem for him.  Thought it was heart burn, with mild nausea and a burning uncomfortable  sensation substernally.  Drinks up to 3 energy drinks daily, with 1 large Monster this afternoon soon before his sx started.  Took 1 x NTG with no improvement.    Vitals stable, well appearing.  No reproducible chest pain.   Given ASA 324 on arrival.  Will work up to rule out ACS as he is high risk.   EKG reassuring, no signs of acute ischemia.   First trop negative.    CXR benign.    Given GI cocktail and sx improved completely.    Labs show mild AKI.  Offered IVF bolus but pt declined.  Given several cups of water and stressed the importance of PO fluid intake for the next few days.    2 x trops negative, so ACS appropriately ruled out based on his history.   Considered stable for discharge.  Likely GERD.  Advised on diet choices and all questions were answered.  Advised close PCP f/u and ED return precautions.     If performed, labs, EKGs, and imaging were reviewed and interpreted by myself and my attending, and incorporated in  the medical decision making.  Patient was seen with ED Attending, Dr. Doroteo Glassman, MD   Lenell Antu, MD 04/23/15 0830  Cathren Laine, MD 04/23/15 [phone]

## 2015-04-22 NOTE — ED Notes (Addendum)
Pt from home for eval of sob and substernal chest pain that started x1 hour ago. Pt denies any radiation but reports some nausea when pain started. Pt took at home nitro with minimal relief. Pt with hx of MI 2 years ago. nad noted. Pt reports he has been drinking more energy drinks and thinks this could be the cause.

## 2015-04-23 NOTE — Discharge Instructions (Signed)

## 2015-06-30 ENCOUNTER — Other Ambulatory Visit: Payer: Self-pay | Admitting: Cardiology

## 2015-07-09 ENCOUNTER — Other Ambulatory Visit: Payer: Self-pay | Admitting: Family Medicine

## 2015-07-09 DIAGNOSIS — M47816 Spondylosis without myelopathy or radiculopathy, lumbar region: Secondary | ICD-10-CM

## 2015-07-18 ENCOUNTER — Ambulatory Visit: Payer: BLUE CROSS/BLUE SHIELD | Admitting: Cardiology

## 2015-07-21 ENCOUNTER — Ambulatory Visit
Admission: RE | Admit: 2015-07-21 | Discharge: 2015-07-21 | Disposition: A | Discharge status: Home / Self Care | Payer: BLUE CROSS/BLUE SHIELD | Source: Ambulatory Visit | Attending: Family Medicine | Admitting: Family Medicine

## 2015-07-21 DIAGNOSIS — M47816 Spondylosis without myelopathy or radiculopathy, lumbar region: Secondary | ICD-10-CM

## 2015-08-13 ENCOUNTER — Other Ambulatory Visit: Payer: Self-pay | Admitting: Cardiology

## 2015-08-18 NOTE — Progress Notes (Signed)
HPI: FU coronary artery disease. [facility] the care of Dr. Sharyn LullHarwani in August of 2014 with an acute inferior lateral myocardial infarction. Cardiac catheterization showed an ejection fraction of 50-55%. There was a distal 95-99% LAD apical stenosis. The first diagonal had an 80-90% proximal lesion. A fourth marginal had a 40-50% lesion in the right coronary had a 30-40% lesion. The patient had PCI of the distal LAD with a drug-eluting stent. Seen in the emergency room July 2016 with chest pain. Enzymes negative. Glucose 157 and creatinine 1.46. Since last seen, the patient has dyspnea with more extreme activities but not with routine activities. It is relieved with rest. It is not associated with chest pain. There is no orthopnea, PND or pedal edema. There is no syncope or palpitations. There is no exertional chest pain.   Current Outpatient Prescriptions  Medication Sig Dispense Refill  . aspirin EC 81 MG EC tablet Take 1 tablet (81 mg total) by mouth daily. 30 tablet 3  . atorvastatin (LIPITOR) 40 MG tablet Take 1 tablet (40 mg total) by mouth daily. 90 tablet 3  . cyclobenzaprine (FLEXERIL) 10 MG tablet Take 1 tablet by mouth as needed.    Marland Kitchen. HYDROcodone-acetaminophen (NORCO/VICODIN) 5-325 MG tablet Take 1 tablet by mouth daily.  0  . lisinopril (PRINIVIL,ZESTRIL) 20 MG tablet Take 1 tablet (20 mg total) by mouth daily. 90 tablet 3  . Multiple Vitamin (MULTIVITAMIN) tablet Take 1 tablet by mouth daily.    Bertram Gala. Polyethyl Glycol-Propyl Glycol (SYSTANE OP) Place 1 drop into both eyes daily.    . tamsulosin (FLOMAX) 0.4 MG CAPS capsule Take 0.4 mg by mouth daily.  5  . testosterone cypionate (DEPOTESTOTERONE CYPIONATE) 200 MG/ML injection Inject 150 mg into the muscle once a week.     . vitamin E 1000 UNIT capsule Take 1,000 Units by mouth daily.    Marland Kitchen. VYVANSE 50 MG capsule Take 100 mg by mouth daily.  0   No current facility-administered medications for  this visit.     Past Medical History  Diagnosis Date  . HTN (hypertension)   . CAD (coronary artery disease)   . Hyperlipidemia   . MI (myocardial infarction) (HCC) 2014    Past Surgical History  Procedure Laterality Date  . Achilles tendon repair    . Tonsillectomy    . Wisdom tooth extraction    . Left heart catheterization with coronary angiogram N/A 05/22/2013    Procedure: LEFT HEART CATHETERIZATION WITH CORONARY ANGIOGRAM;  Surgeon: Robynn PaneMohan N Harwani, MD;  [facility];  Service: Cardiovascular;  Laterality: N/A;    Social History   Social History  . Marital Status: Single    Spouse Name: N/A  . Number of Children: 4  . Years of Education: N/A   Occupational History  .     Social History Main Topics  . Smoking status: Never Smoker   . Smokeless tobacco: Not on file  . Alcohol Use: Yes  . Drug Use: No  . Sexual Activity: Not on file   Other Topics Concern  . Not on file   Social History Narrative    ROS: no fevers or chills, productive cough, hemoptysis, dysphasia, odynophagia, melena, hematochezia, dysuria, hematuria, rash, seizure activity, orthopnea, PND, pedal edema, claudication. Remaining systems are negative.  Physical Exam: Well-developed well-nourished in no acute distress.  Skin is warm and dry.  HEENT is normal.  Neck is supple.  Chest is  clear to auscultation with normal expansion.  Cardiovascular exam is regular rate and rhythm.  Abdominal exam nontender or distended. No masses palpated. Extremities show no edema. neuro grossly intact

## 2015-08-19 ENCOUNTER — Encounter: Payer: Self-pay | Admitting: Cardiology

## 2015-08-19 ENCOUNTER — Ambulatory Visit (INDEPENDENT_AMBULATORY_CARE_PROVIDER_SITE_OTHER): Payer: BLUE CROSS/BLUE SHIELD | Admitting: Cardiology

## 2015-08-19 DIAGNOSIS — I2510 Atherosclerotic heart disease of native coronary artery without angina pectoris: Secondary | ICD-10-CM

## 2015-08-19 DIAGNOSIS — I10 Essential (primary) hypertension: Secondary | ICD-10-CM

## 2015-08-19 MED ORDER — LISINOPRIL 20 MG PO TABS: 20.0000 mg | ORAL_TABLET | Freq: Every day | ORAL | Status: DC

## 2015-08-19 MED ORDER — ATORVASTATIN CALCIUM 80 MG PO TABS: 80.0000 mg | ORAL_TABLET | Freq: Every day | ORAL | Status: DC

## 2015-08-19 NOTE — Assessment & Plan Note (Signed)
Change Lipitor to 80 mg daily. Check lipids and liver in 4 weeks. 

## 2015-08-19 NOTE — Assessment & Plan Note (Signed)
Continue present blood pressure medications. Check potassium and renal function. 

## 2015-08-19 NOTE — Patient Instructions (Signed)
Medication Instructions:   INCREASE ATORVASTATIN TO 80 MG ONCE DAILY  Labwork:  Your physician recommends that you return for lab work in: 4 WEEKS= DO NOT EAT PRIOR TO LAB WORK  Follow-Up:  Your physician wants you to follow-up in: ONE YEAR WITH DR Shelda PalRENSHAW You will receive a reminder letter in the mail two months in advance. If you don't receive a letter, please call our office to schedule the follow-up appointment.   If you need a refill on your cardiac medications before your next appointment, please call your pharmacy.

## 2015-08-19 NOTE — Assessment & Plan Note (Signed)
Continue aspirin and statin. 

## 2015-09-20 IMAGING — CR DG CHEST 2V
2 series · 2 of 2 positions shown · non-contrast
Comparison: None.

CLINICAL DATA: Pt had an odd feeling in his chest today, kind of
like heart burn, but more intense.Hx of Myocardial infarction, HTN-
on meds, High cholesterol- on meds, CAD, non smoker

EXAM:
CHEST  2 VIEW

[w chest pa]
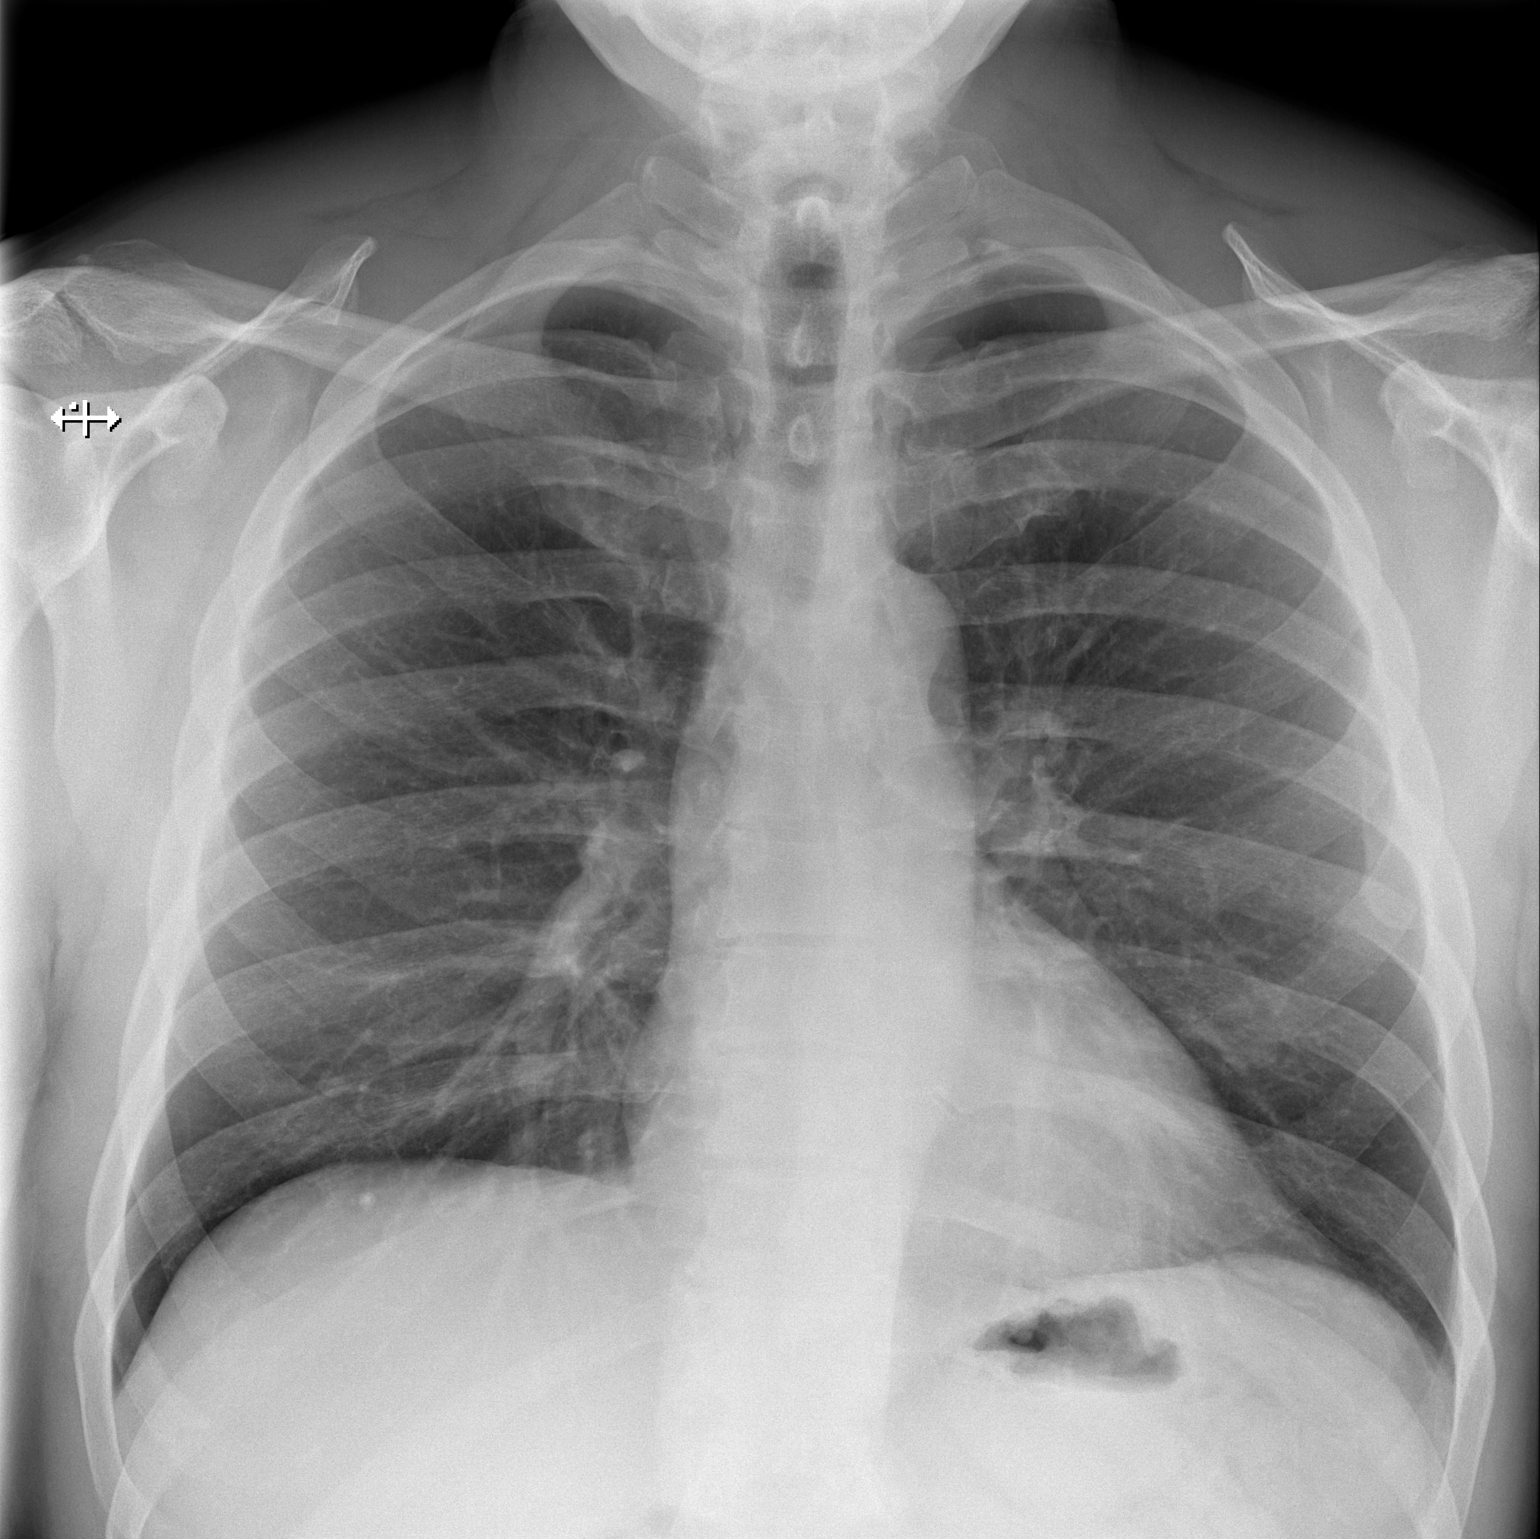

[w chest lat]
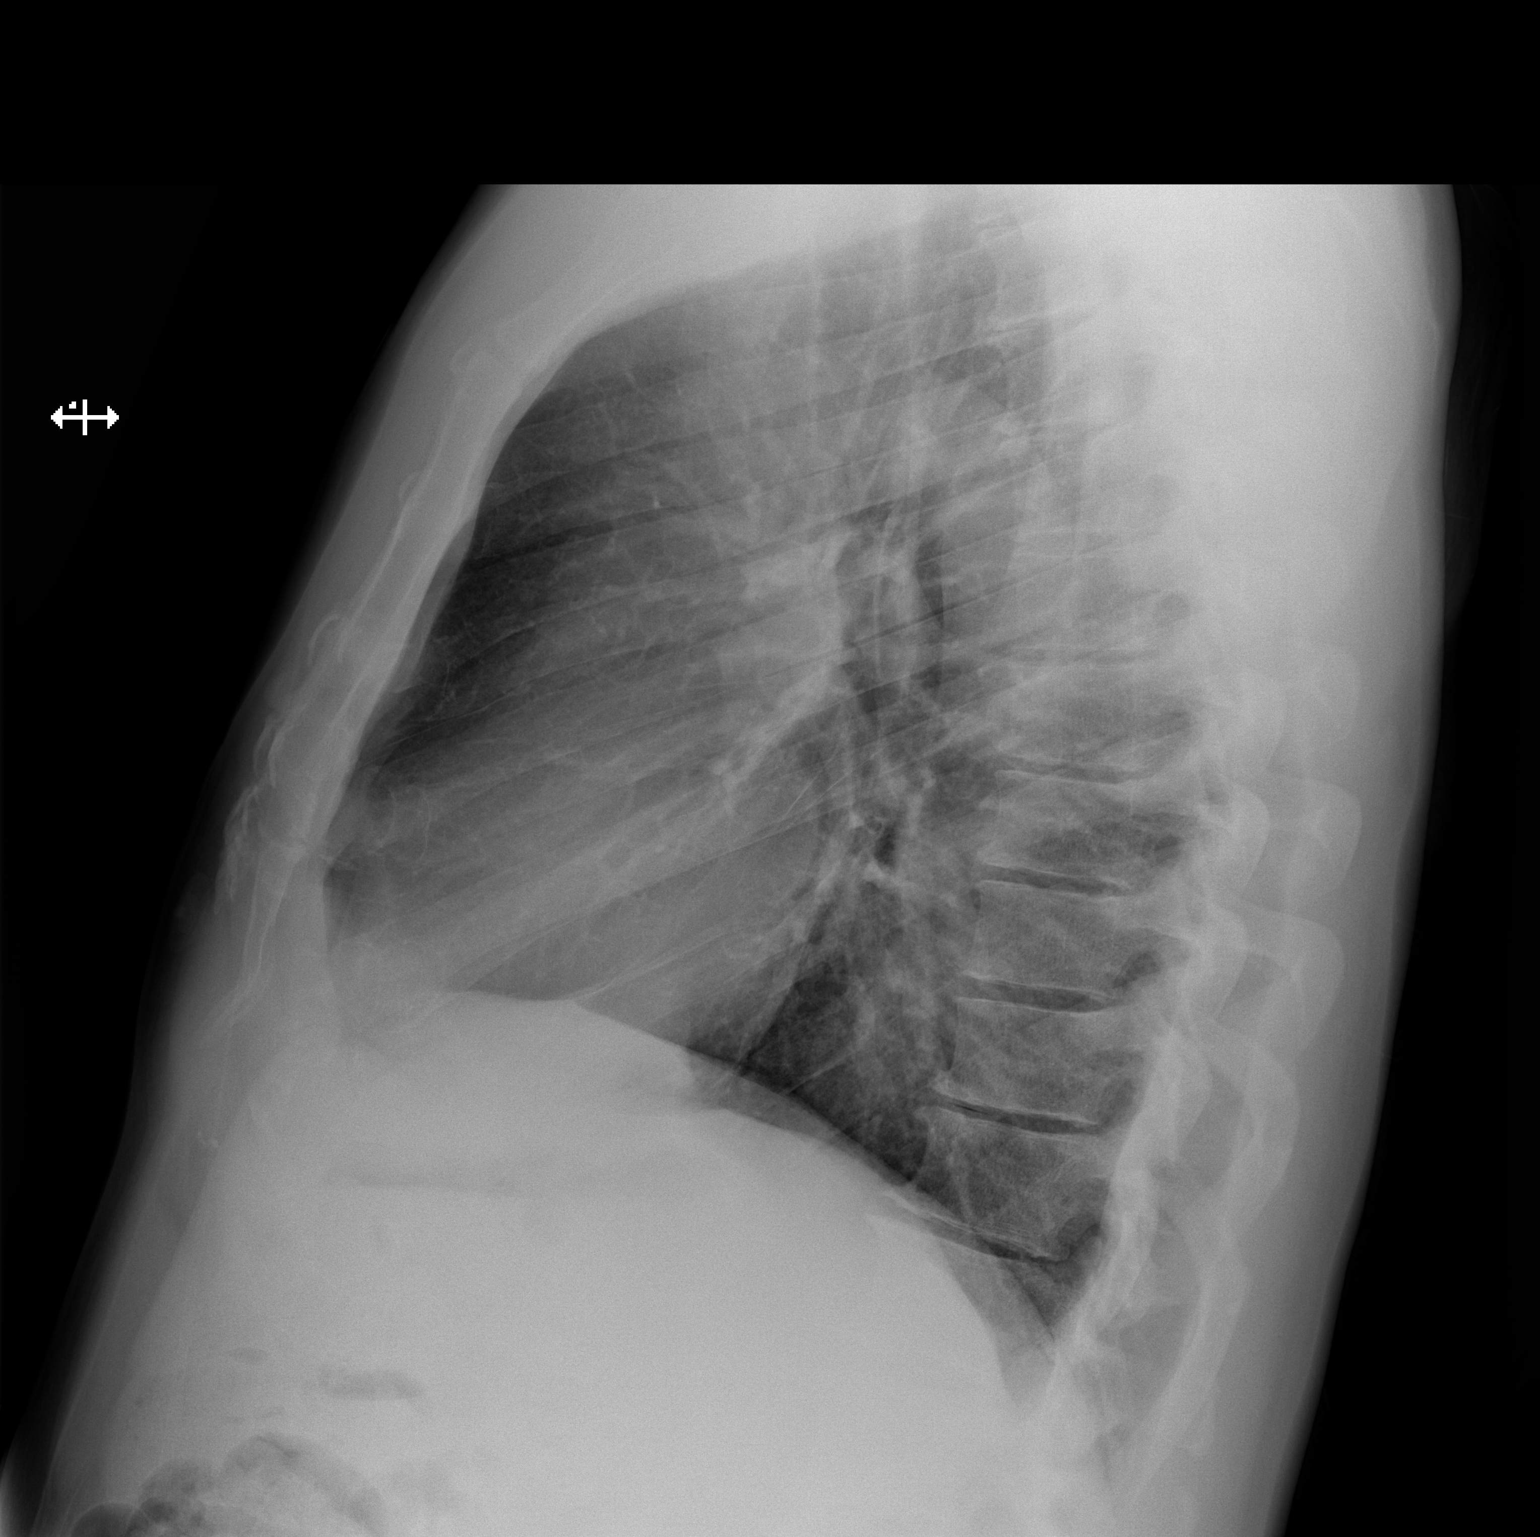

[2 of 2 positions shown; findings below may reference images not displayed]

FINDINGS: The heart size and mediastinal contours are within normal limits.
Both lungs are clear. No pleural effusion or pneumothorax. The
visualized skeletal structures are unremarkable.
IMPRESSION: No active cardiopulmonary disease.

## 2015-11-24 ENCOUNTER — Other Ambulatory Visit: Payer: Self-pay | Admitting: Family Medicine

## 2015-11-24 ENCOUNTER — Ambulatory Visit
Admission: RE | Admit: 2015-11-24 | Discharge: 2015-11-24 | Disposition: A | Discharge status: Home / Self Care | Payer: BLUE CROSS/BLUE SHIELD | Source: Ambulatory Visit | Attending: Family Medicine | Admitting: Family Medicine

## 2015-11-24 DIAGNOSIS — M25552 Pain in left hip: Secondary | ICD-10-CM

## 2016-03-22 DIAGNOSIS — F439 Reaction to severe stress, unspecified: Secondary | ICD-10-CM | POA: Diagnosis not present

## 2016-03-24 DIAGNOSIS — F439 Reaction to severe stress, unspecified: Secondary | ICD-10-CM | POA: Diagnosis not present

## 2016-03-25 DIAGNOSIS — S70369A Insect bite (nonvenomous), unspecified thigh, initial encounter: Secondary | ICD-10-CM | POA: Diagnosis not present

## 2016-03-25 DIAGNOSIS — W57XXXA Bitten or stung by nonvenomous insect and other nonvenomous arthropods, initial encounter: Secondary | ICD-10-CM | POA: Diagnosis not present

## 2016-04-12 DIAGNOSIS — H02831 Dermatochalasis of right upper eyelid: Secondary | ICD-10-CM | POA: Diagnosis not present

## 2016-04-12 DIAGNOSIS — H0279 Other degenerative disorders of eyelid and periocular area: Secondary | ICD-10-CM | POA: Diagnosis not present

## 2016-04-12 DIAGNOSIS — H02413 Mechanical ptosis of bilateral eyelids: Secondary | ICD-10-CM | POA: Diagnosis not present

## 2016-04-12 DIAGNOSIS — H53483 Generalized contraction of visual field, bilateral: Secondary | ICD-10-CM | POA: Diagnosis not present

## 2016-04-12 DIAGNOSIS — H02834 Dermatochalasis of left upper eyelid: Secondary | ICD-10-CM | POA: Diagnosis not present

## 2016-04-14 DIAGNOSIS — F439 Reaction to severe stress, unspecified: Secondary | ICD-10-CM | POA: Diagnosis not present

## 2016-04-19 DIAGNOSIS — G4733 Obstructive sleep apnea (adult) (pediatric): Secondary | ICD-10-CM | POA: Diagnosis not present

## 2016-04-21 DIAGNOSIS — F439 Reaction to severe stress, unspecified: Secondary | ICD-10-CM | POA: Diagnosis not present

## 2016-04-23 DIAGNOSIS — F4323 Adjustment disorder with mixed anxiety and depressed mood: Secondary | ICD-10-CM | POA: Diagnosis not present

## 2016-04-28 DIAGNOSIS — F439 Reaction to severe stress, unspecified: Secondary | ICD-10-CM | POA: Diagnosis not present

## 2016-05-17 DIAGNOSIS — F4323 Adjustment disorder with mixed anxiety and depressed mood: Secondary | ICD-10-CM | POA: Diagnosis not present

## 2016-05-19 DIAGNOSIS — F4323 Adjustment disorder with mixed anxiety and depressed mood: Secondary | ICD-10-CM | POA: Diagnosis not present

## 2016-05-21 DIAGNOSIS — G4733 Obstructive sleep apnea (adult) (pediatric): Secondary | ICD-10-CM | POA: Diagnosis not present

## 2016-05-27 DIAGNOSIS — F4323 Adjustment disorder with mixed anxiety and depressed mood: Secondary | ICD-10-CM | POA: Diagnosis not present

## 2016-06-11 DIAGNOSIS — F4323 Adjustment disorder with mixed anxiety and depressed mood: Secondary | ICD-10-CM | POA: Diagnosis not present

## 2016-06-15 DIAGNOSIS — F4323 Adjustment disorder with mixed anxiety and depressed mood: Secondary | ICD-10-CM | POA: Diagnosis not present

## 2016-06-22 DIAGNOSIS — F4323 Adjustment disorder with mixed anxiety and depressed mood: Secondary | ICD-10-CM | POA: Diagnosis not present

## 2016-06-22 DIAGNOSIS — F4320 Adjustment disorder, unspecified: Secondary | ICD-10-CM | POA: Diagnosis not present

## 2016-06-22 DIAGNOSIS — G4733 Obstructive sleep apnea (adult) (pediatric): Secondary | ICD-10-CM | POA: Diagnosis not present

## 2016-06-29 DIAGNOSIS — F4323 Adjustment disorder with mixed anxiety and depressed mood: Secondary | ICD-10-CM | POA: Diagnosis not present

## 2016-07-06 DIAGNOSIS — F4323 Adjustment disorder with mixed anxiety and depressed mood: Secondary | ICD-10-CM | POA: Diagnosis not present

## 2016-07-20 DIAGNOSIS — F4323 Adjustment disorder with mixed anxiety and depressed mood: Secondary | ICD-10-CM | POA: Diagnosis not present

## 2016-07-22 DIAGNOSIS — F4321 Adjustment disorder with depressed mood: Secondary | ICD-10-CM | POA: Diagnosis not present

## 2016-07-23 DIAGNOSIS — K921 Melena: Secondary | ICD-10-CM | POA: Diagnosis not present

## 2016-07-27 DIAGNOSIS — G4733 Obstructive sleep apnea (adult) (pediatric): Secondary | ICD-10-CM | POA: Diagnosis not present

## 2016-07-29 DIAGNOSIS — F4321 Adjustment disorder with depressed mood: Secondary | ICD-10-CM | POA: Diagnosis not present

## 2016-08-09 DIAGNOSIS — F4321 Adjustment disorder with depressed mood: Secondary | ICD-10-CM | POA: Diagnosis not present

## 2016-08-10 DIAGNOSIS — F4323 Adjustment disorder with mixed anxiety and depressed mood: Secondary | ICD-10-CM | POA: Diagnosis not present

## 2016-08-16 DIAGNOSIS — F4321 Adjustment disorder with depressed mood: Secondary | ICD-10-CM | POA: Diagnosis not present

## 2016-08-17 DIAGNOSIS — K921 Melena: Secondary | ICD-10-CM | POA: Diagnosis not present

## 2016-08-24 DIAGNOSIS — Z79899 Other long term (current) drug therapy: Secondary | ICD-10-CM | POA: Diagnosis not present

## 2016-08-24 DIAGNOSIS — I2510 Atherosclerotic heart disease of native coronary artery without angina pectoris: Secondary | ICD-10-CM | POA: Diagnosis not present

## 2016-08-24 DIAGNOSIS — G4733 Obstructive sleep apnea (adult) (pediatric): Secondary | ICD-10-CM | POA: Diagnosis not present

## 2016-08-24 DIAGNOSIS — F900 Attention-deficit hyperactivity disorder, predominantly inattentive type: Secondary | ICD-10-CM | POA: Diagnosis not present

## 2016-08-26 DIAGNOSIS — G4733 Obstructive sleep apnea (adult) (pediatric): Secondary | ICD-10-CM | POA: Diagnosis not present

## 2016-08-30 DIAGNOSIS — F4321 Adjustment disorder with depressed mood: Secondary | ICD-10-CM | POA: Diagnosis not present

## 2016-08-30 DIAGNOSIS — K625 Hemorrhage of anus and rectum: Secondary | ICD-10-CM | POA: Diagnosis not present

## 2016-09-06 DIAGNOSIS — G4733 Obstructive sleep apnea (adult) (pediatric): Secondary | ICD-10-CM | POA: Diagnosis not present

## 2016-09-14 DIAGNOSIS — F4321 Adjustment disorder with depressed mood: Secondary | ICD-10-CM | POA: Diagnosis not present

## 2016-09-25 DIAGNOSIS — G4733 Obstructive sleep apnea (adult) (pediatric): Secondary | ICD-10-CM | POA: Diagnosis not present

## 2016-10-04 DIAGNOSIS — F4321 Adjustment disorder with depressed mood: Secondary | ICD-10-CM | POA: Diagnosis not present

## 2016-10-12 NOTE — Progress Notes (Signed)
HPI: FU coronary artery disease. Patient was [facility] under the care of Dr. Sharyn LullHarwani in August of 2014 with an acute inferior lateral myocardial infarction. Cardiac catheterization showed an ejection fraction of 50-55%. There was a distal 95-99% LAD apical stenosis. The first diagonal had an 80-90% proximal lesion. A fourth marginal had a 40-50% lesion in the right coronary had a 30-40% lesion. The patient had PCI of the distal LAD with a drug-eluting stent. Since last seen, the patient denies any dyspnea on exertion, orthopnea, PND, pedal edema, palpitations, syncope or chest pain.   Current Outpatient Prescriptions  Medication Sig Dispense Refill  . aspirin EC 81 MG EC tablet Take 1 tablet (81 mg total) by mouth daily. 30 tablet 3  . atorvastatin (LIPITOR) 80 MG tablet Take 1 tablet (80 mg total) by mouth daily. 90 tablet 3  . cyclobenzaprine (FLEXERIL) 10 MG tablet Take 1 tablet by mouth as needed.    Marland Kitchen. HYDROcodone-acetaminophen (NORCO/VICODIN) 5-325 MG tablet Take 1 tablet by mouth daily.  0  . lisinopril (PRINIVIL,ZESTRIL) 20 MG tablet Take 1 tablet (20 mg total) by mouth daily. 90 tablet 3  . Multiple Vitamin (MULTIVITAMIN) tablet Take 1 tablet by mouth daily.    Bertram Gala. Polyethyl Glycol-Propyl Glycol (SYSTANE OP) Place 1 drop into both eyes daily.    . tamsulosin (FLOMAX) 0.4 MG CAPS capsule Take 0.4 mg by mouth daily.  5  . testosterone cypionate (DEPOTESTOTERONE CYPIONATE) 200 MG/ML injection Inject 150 mg into the muscle once a week.     . vitamin E 1000 UNIT capsule Take 1,000 Units by mouth daily.    Marland Kitchen. VYVANSE 50 MG capsule Take 100 mg by mouth daily.  0   No current facility-administered medications for this visit.      Past Medical History:  Diagnosis Date  . CAD (coronary artery disease)   . HTN (hypertension)   . Hyperlipidemia   . MI (myocardial infarction) 2014    Past Surgical History:  Procedure Laterality Date  . ACHILLES TENDON REPAIR      . LEFT HEART CATHETERIZATION WITH CORONARY ANGIOGRAM N/A 05/22/2013   Procedure: LEFT HEART CATHETERIZATION WITH CORONARY ANGIOGRAM;  Surgeon: Robynn PaneMohan N Harwani, MD;  Location: [facility] CATH LAB;  Service: Cardiovascular;  Laterality: N/A;  . TONSILLECTOMY    . WISDOM TOOTH EXTRACTION      Social History   Social History  . Marital status: Single    Spouse name: N/A  . Number of children: 4  . Years of education: N/A   Occupational History  .  [employer]   Social History Main Topics  . Smoking status: Never Smoker  . Smokeless tobacco: Never Used  . Alcohol use Yes  . Drug use: No  . Sexual activity: Not on file   Other Topics Concern  . Not on file   Social History Narrative  . No narrative on file    Family History  Problem Relation Age of Onset  . Heart disease      No family history    ROS: no fevers or chills, productive cough, hemoptysis, dysphasia, odynophagia, melena, hematochezia, dysuria, hematuria, rash, seizure activity, orthopnea, PND, pedal edema, claudication. Remaining systems are negative.  Physical Exam: Well-developed well-nourished in no acute distress.  Skin is warm and dry.  HEENT is normal.  Neck is supple.  Chest is clear to auscultation with normal expansion.  Cardiovascular exam is regular rate and rhythm.  Abdominal exam nontender  or distended. No masses palpated. Extremities show no edema. neuro grossly intact  ECG-Sinus rhythm at a rate of 80. Septal infarct. Inferior T-wave changes.  A/P  1 hypertension-blood pressure controlled. Continue present medications. Check potassium and renal function  2 hyperlipidemia-continue statin. Check lipids and liver.  3 coronary artery disease-continue aspirin and statin.  Olga MillersBrian Billyjoe Go, MD

## 2016-10-21 ENCOUNTER — Encounter: Payer: Self-pay | Admitting: Cardiology

## 2016-10-21 ENCOUNTER — Ambulatory Visit (INDEPENDENT_AMBULATORY_CARE_PROVIDER_SITE_OTHER): Payer: BLUE CROSS/BLUE SHIELD | Admitting: Cardiology

## 2016-10-21 VITALS — BP 120/76 | HR 80 | Ht 74.0 in | Wt 246.0 lb

## 2016-10-21 DIAGNOSIS — Z79899 Other long term (current) drug therapy: Secondary | ICD-10-CM

## 2016-10-21 DIAGNOSIS — I10 Essential (primary) hypertension: Secondary | ICD-10-CM | POA: Diagnosis not present

## 2016-10-21 DIAGNOSIS — I2510 Atherosclerotic heart disease of native coronary artery without angina pectoris: Secondary | ICD-10-CM

## 2016-10-21 DIAGNOSIS — E785 Hyperlipidemia, unspecified: Secondary | ICD-10-CM | POA: Diagnosis not present

## 2016-10-21 NOTE — Patient Instructions (Addendum)
Medication Instructions:  Your physician recommends that you continue on your current medications as directed. Please refer to the Current Medication list given to you today.  Labwork: Please have lab work completed today.  Testing/Procedures: NONE  Follow-Up: Your physician recommends that you schedule a follow-up appointment in: 1 yea   If you need a refill on your cardiac medications before your next appointment, please call your pharmacy.

## 2016-10-25 DIAGNOSIS — G4733 Obstructive sleep apnea (adult) (pediatric): Secondary | ICD-10-CM | POA: Diagnosis not present

## 2016-10-25 DIAGNOSIS — F4321 Adjustment disorder with depressed mood: Secondary | ICD-10-CM | POA: Diagnosis not present

## 2016-11-09 ENCOUNTER — Other Ambulatory Visit: Payer: Self-pay | Admitting: Cardiology

## 2016-11-09 DIAGNOSIS — I2510 Atherosclerotic heart disease of native coronary artery without angina pectoris: Secondary | ICD-10-CM

## 2016-11-09 NOTE — Telephone Encounter (Signed)
REFILL 

## 2016-11-17 ENCOUNTER — Encounter: Payer: Self-pay | Admitting: *Deleted

## 2016-11-18 ENCOUNTER — Encounter: Payer: Self-pay | Admitting: *Deleted

## 2016-11-24 DIAGNOSIS — G4733 Obstructive sleep apnea (adult) (pediatric): Secondary | ICD-10-CM | POA: Diagnosis not present

## 2016-11-29 ENCOUNTER — Other Ambulatory Visit: Payer: Self-pay | Admitting: Cardiology

## 2016-11-29 DIAGNOSIS — H0279 Other degenerative disorders of eyelid and periocular area: Secondary | ICD-10-CM | POA: Diagnosis not present

## 2016-11-29 DIAGNOSIS — H02831 Dermatochalasis of right upper eyelid: Secondary | ICD-10-CM | POA: Diagnosis not present

## 2016-11-29 DIAGNOSIS — H02834 Dermatochalasis of left upper eyelid: Secondary | ICD-10-CM | POA: Diagnosis not present

## 2016-11-29 DIAGNOSIS — I2510 Atherosclerotic heart disease of native coronary artery without angina pectoris: Secondary | ICD-10-CM

## 2016-11-29 DIAGNOSIS — H02413 Mechanical ptosis of bilateral eyelids: Secondary | ICD-10-CM | POA: Diagnosis not present

## 2016-12-23 DIAGNOSIS — M9901 Segmental and somatic dysfunction of cervical region: Secondary | ICD-10-CM | POA: Diagnosis not present

## 2017-01-27 DIAGNOSIS — M9901 Segmental and somatic dysfunction of cervical region: Secondary | ICD-10-CM | POA: Diagnosis not present

## 2017-02-08 DIAGNOSIS — Z79899 Other long term (current) drug therapy: Secondary | ICD-10-CM | POA: Diagnosis not present

## 2017-02-08 DIAGNOSIS — N138 Other obstructive and reflux uropathy: Secondary | ICD-10-CM | POA: Diagnosis not present

## 2017-02-08 DIAGNOSIS — R3912 Poor urinary stream: Secondary | ICD-10-CM | POA: Diagnosis not present

## 2017-02-08 DIAGNOSIS — F902 Attention-deficit hyperactivity disorder, combined type: Secondary | ICD-10-CM | POA: Diagnosis not present

## 2017-02-08 DIAGNOSIS — G4733 Obstructive sleep apnea (adult) (pediatric): Secondary | ICD-10-CM | POA: Diagnosis not present

## 2017-02-08 DIAGNOSIS — F900 Attention-deficit hyperactivity disorder, predominantly inattentive type: Secondary | ICD-10-CM | POA: Diagnosis not present

## 2017-02-08 DIAGNOSIS — M9901 Segmental and somatic dysfunction of cervical region: Secondary | ICD-10-CM | POA: Diagnosis not present

## 2017-02-09 DIAGNOSIS — M9901 Segmental and somatic dysfunction of cervical region: Secondary | ICD-10-CM | POA: Diagnosis not present

## 2017-03-29 DIAGNOSIS — F4325 Adjustment disorder with mixed disturbance of emotions and conduct: Secondary | ICD-10-CM | POA: Diagnosis not present

## 2017-04-19 DIAGNOSIS — F319 Bipolar disorder, unspecified: Secondary | ICD-10-CM | POA: Diagnosis not present

## 2017-04-19 DIAGNOSIS — M47816 Spondylosis without myelopathy or radiculopathy, lumbar region: Secondary | ICD-10-CM | POA: Diagnosis not present

## 2017-04-19 DIAGNOSIS — G894 Chronic pain syndrome: Secondary | ICD-10-CM | POA: Diagnosis not present

## 2017-04-21 DIAGNOSIS — F4325 Adjustment disorder with mixed disturbance of emotions and conduct: Secondary | ICD-10-CM | POA: Diagnosis not present

## 2017-04-29 DIAGNOSIS — F4325 Adjustment disorder with mixed disturbance of emotions and conduct: Secondary | ICD-10-CM | POA: Diagnosis not present

## 2017-05-09 DIAGNOSIS — F4325 Adjustment disorder with mixed disturbance of emotions and conduct: Secondary | ICD-10-CM | POA: Diagnosis not present

## 2017-05-10 DIAGNOSIS — F1920 Other psychoactive substance dependence, uncomplicated: Secondary | ICD-10-CM | POA: Diagnosis not present

## 2017-05-10 DIAGNOSIS — Z79899 Other long term (current) drug therapy: Secondary | ICD-10-CM | POA: Diagnosis not present

## 2017-05-10 DIAGNOSIS — G4733 Obstructive sleep apnea (adult) (pediatric): Secondary | ICD-10-CM | POA: Diagnosis not present

## 2017-05-10 DIAGNOSIS — F902 Attention-deficit hyperactivity disorder, combined type: Secondary | ICD-10-CM | POA: Diagnosis not present

## 2017-05-10 DIAGNOSIS — F319 Bipolar disorder, unspecified: Secondary | ICD-10-CM | POA: Diagnosis not present

## 2017-05-16 DIAGNOSIS — F4325 Adjustment disorder with mixed disturbance of emotions and conduct: Secondary | ICD-10-CM | POA: Diagnosis not present

## 2017-05-24 DIAGNOSIS — F4325 Adjustment disorder with mixed disturbance of emotions and conduct: Secondary | ICD-10-CM | POA: Diagnosis not present

## 2017-05-27 ENCOUNTER — Other Ambulatory Visit: Payer: Self-pay | Admitting: Cardiology

## 2017-05-27 DIAGNOSIS — I2510 Atherosclerotic heart disease of native coronary artery without angina pectoris: Secondary | ICD-10-CM

## 2017-06-06 DIAGNOSIS — M9901 Segmental and somatic dysfunction of cervical region: Secondary | ICD-10-CM | POA: Diagnosis not present

## 2017-06-06 DIAGNOSIS — F4325 Adjustment disorder with mixed disturbance of emotions and conduct: Secondary | ICD-10-CM | POA: Diagnosis not present

## 2017-06-07 DIAGNOSIS — M9901 Segmental and somatic dysfunction of cervical region: Secondary | ICD-10-CM | POA: Diagnosis not present

## 2017-06-08 DIAGNOSIS — M9901 Segmental and somatic dysfunction of cervical region: Secondary | ICD-10-CM | POA: Diagnosis not present

## 2017-06-09 DIAGNOSIS — F319 Bipolar disorder, unspecified: Secondary | ICD-10-CM | POA: Diagnosis not present

## 2017-06-16 DIAGNOSIS — M9901 Segmental and somatic dysfunction of cervical region: Secondary | ICD-10-CM | POA: Diagnosis not present

## 2017-07-04 DIAGNOSIS — F4325 Adjustment disorder with mixed disturbance of emotions and conduct: Secondary | ICD-10-CM | POA: Diagnosis not present

## 2017-07-25 DIAGNOSIS — F4325 Adjustment disorder with mixed disturbance of emotions and conduct: Secondary | ICD-10-CM | POA: Diagnosis not present

## 2017-08-08 DIAGNOSIS — F4325 Adjustment disorder with mixed disturbance of emotions and conduct: Secondary | ICD-10-CM | POA: Diagnosis not present

## 2017-08-19 DIAGNOSIS — G4733 Obstructive sleep apnea (adult) (pediatric): Secondary | ICD-10-CM | POA: Diagnosis not present

## 2017-08-19 DIAGNOSIS — F900 Attention-deficit hyperactivity disorder, predominantly inattentive type: Secondary | ICD-10-CM | POA: Diagnosis not present

## 2017-08-19 DIAGNOSIS — Z79899 Other long term (current) drug therapy: Secondary | ICD-10-CM | POA: Diagnosis not present

## 2017-08-19 DIAGNOSIS — F902 Attention-deficit hyperactivity disorder, combined type: Secondary | ICD-10-CM | POA: Diagnosis not present

## 2017-08-29 DIAGNOSIS — F4325 Adjustment disorder with mixed disturbance of emotions and conduct: Secondary | ICD-10-CM | POA: Diagnosis not present

## 2017-09-12 DIAGNOSIS — F4325 Adjustment disorder with mixed disturbance of emotions and conduct: Secondary | ICD-10-CM | POA: Diagnosis not present

## 2017-10-03 DIAGNOSIS — F4325 Adjustment disorder with mixed disturbance of emotions and conduct: Secondary | ICD-10-CM | POA: Diagnosis not present

## 2017-10-21 DIAGNOSIS — Z0000 Encounter for general adult medical examination without abnormal findings: Secondary | ICD-10-CM | POA: Diagnosis not present

## 2017-10-21 DIAGNOSIS — Z125 Encounter for screening for malignant neoplasm of prostate: Secondary | ICD-10-CM | POA: Diagnosis not present

## 2017-10-21 DIAGNOSIS — I10 Essential (primary) hypertension: Secondary | ICD-10-CM | POA: Diagnosis not present

## 2017-10-21 DIAGNOSIS — Z6832 Body mass index (BMI) 32.0-32.9, adult: Secondary | ICD-10-CM | POA: Diagnosis not present

## 2017-10-27 DIAGNOSIS — F4325 Adjustment disorder with mixed disturbance of emotions and conduct: Secondary | ICD-10-CM | POA: Diagnosis not present

## 2017-11-23 DIAGNOSIS — M9901 Segmental and somatic dysfunction of cervical region: Secondary | ICD-10-CM | POA: Diagnosis not present

## 2017-11-24 DIAGNOSIS — F4325 Adjustment disorder with mixed disturbance of emotions and conduct: Secondary | ICD-10-CM | POA: Diagnosis not present

## 2017-11-25 DIAGNOSIS — Z79899 Other long term (current) drug therapy: Secondary | ICD-10-CM | POA: Diagnosis not present

## 2017-11-25 DIAGNOSIS — F900 Attention-deficit hyperactivity disorder, predominantly inattentive type: Secondary | ICD-10-CM | POA: Diagnosis not present

## 2017-11-25 DIAGNOSIS — I2510 Atherosclerotic heart disease of native coronary artery without angina pectoris: Secondary | ICD-10-CM | POA: Diagnosis not present

## 2017-11-25 DIAGNOSIS — G4733 Obstructive sleep apnea (adult) (pediatric): Secondary | ICD-10-CM | POA: Diagnosis not present

## 2018-01-05 DIAGNOSIS — F4325 Adjustment disorder with mixed disturbance of emotions and conduct: Secondary | ICD-10-CM | POA: Diagnosis not present

## 2018-02-16 DIAGNOSIS — F902 Attention-deficit hyperactivity disorder, combined type: Secondary | ICD-10-CM | POA: Diagnosis not present

## 2018-02-16 DIAGNOSIS — F900 Attention-deficit hyperactivity disorder, predominantly inattentive type: Secondary | ICD-10-CM | POA: Diagnosis not present

## 2018-02-16 DIAGNOSIS — Z79899 Other long term (current) drug therapy: Secondary | ICD-10-CM | POA: Diagnosis not present

## 2018-04-13 DIAGNOSIS — G4733 Obstructive sleep apnea (adult) (pediatric): Secondary | ICD-10-CM | POA: Diagnosis not present

## 2018-04-13 DIAGNOSIS — K219 Gastro-esophageal reflux disease without esophagitis: Secondary | ICD-10-CM | POA: Diagnosis not present

## 2018-04-13 DIAGNOSIS — Z7289 Other problems related to lifestyle: Secondary | ICD-10-CM | POA: Diagnosis not present

## 2018-04-13 DIAGNOSIS — H6123 Impacted cerumen, bilateral: Secondary | ICD-10-CM | POA: Diagnosis not present

## 2018-05-19 DIAGNOSIS — F902 Attention-deficit hyperactivity disorder, combined type: Secondary | ICD-10-CM | POA: Diagnosis not present

## 2018-05-19 DIAGNOSIS — Z79899 Other long term (current) drug therapy: Secondary | ICD-10-CM | POA: Diagnosis not present

## 2018-05-19 DIAGNOSIS — F900 Attention-deficit hyperactivity disorder, predominantly inattentive type: Secondary | ICD-10-CM | POA: Diagnosis not present

## 2018-05-19 DIAGNOSIS — G4733 Obstructive sleep apnea (adult) (pediatric): Secondary | ICD-10-CM | POA: Diagnosis not present

## 2018-06-12 ENCOUNTER — Telehealth: Payer: Self-pay | Admitting: Cardiology

## 2018-06-12 NOTE — Telephone Encounter (Signed)
New Message      *STAT* If patient is at the pharmacy, call can be transferred to refill team.   1. Which medications need to be refilled? (please list name of each medication and dose if known) nitroGLYCERIN (NITROSTAT) 0.4 MG SL tablet   2. Which pharmacy/location (including street and city if local pharmacy) is medication to be sent to? [id number] - [place], [place] - [address] [facility] [address]  3. Do they need a 30 day or 90 day supply? 30

## 2018-06-15 ENCOUNTER — Other Ambulatory Visit: Payer: Self-pay | Admitting: *Deleted

## 2018-06-15 DIAGNOSIS — H16203 Unspecified keratoconjunctivitis, bilateral: Secondary | ICD-10-CM | POA: Diagnosis not present

## 2018-10-05 DIAGNOSIS — F900 Attention-deficit hyperactivity disorder, predominantly inattentive type: Secondary | ICD-10-CM | POA: Diagnosis not present

## 2018-10-05 DIAGNOSIS — Z79899 Other long term (current) drug therapy: Secondary | ICD-10-CM | POA: Diagnosis not present

## 2018-10-05 DIAGNOSIS — G4733 Obstructive sleep apnea (adult) (pediatric): Secondary | ICD-10-CM | POA: Diagnosis not present

## 2018-10-25 DIAGNOSIS — S6991XA Unspecified injury of right wrist, hand and finger(s), initial encounter: Secondary | ICD-10-CM | POA: Diagnosis not present

## 2018-10-25 DIAGNOSIS — M79644 Pain in right finger(s): Secondary | ICD-10-CM | POA: Diagnosis not present

## 2018-10-31 DIAGNOSIS — R3912 Poor urinary stream: Secondary | ICD-10-CM | POA: Diagnosis not present

## 2018-10-31 DIAGNOSIS — N401 Enlarged prostate with lower urinary tract symptoms: Secondary | ICD-10-CM | POA: Diagnosis not present

## 2018-10-31 DIAGNOSIS — Z125 Encounter for screening for malignant neoplasm of prostate: Secondary | ICD-10-CM | POA: Diagnosis not present

## 2018-11-03 DIAGNOSIS — Z0000 Encounter for general adult medical examination without abnormal findings: Secondary | ICD-10-CM | POA: Diagnosis not present

## 2019-01-03 DIAGNOSIS — R05 Cough: Secondary | ICD-10-CM | POA: Diagnosis not present

## 2019-03-13 DIAGNOSIS — F900 Attention-deficit hyperactivity disorder, predominantly inattentive type: Secondary | ICD-10-CM | POA: Diagnosis not present

## 2019-06-20 DIAGNOSIS — Z79899 Other long term (current) drug therapy: Secondary | ICD-10-CM | POA: Diagnosis not present

## 2019-06-20 DIAGNOSIS — F900 Attention-deficit hyperactivity disorder, predominantly inattentive type: Secondary | ICD-10-CM | POA: Diagnosis not present

## 2019-09-19 DIAGNOSIS — F900 Attention-deficit hyperactivity disorder, predominantly inattentive type: Secondary | ICD-10-CM | POA: Diagnosis not present

## 2019-09-19 DIAGNOSIS — Z79899 Other long term (current) drug therapy: Secondary | ICD-10-CM | POA: Diagnosis not present

## 2019-10-01 DIAGNOSIS — Z20828 Contact with and (suspected) exposure to other viral communicable diseases: Secondary | ICD-10-CM | POA: Diagnosis not present

## 2019-10-23 ENCOUNTER — Encounter (HOSPITAL_COMMUNITY): Payer: Self-pay

## 2019-10-23 ENCOUNTER — Inpatient Hospital Stay (HOSPITAL_COMMUNITY)
Admission: EM | Admit: 2019-10-23 | Discharge: 2019-10-25 | DRG: 247 | Disposition: A | Discharge status: Home / Self Care | Payer: BC Managed Care – PPO | Attending: Cardiology | Admitting: Cardiology

## 2019-10-23 ENCOUNTER — Emergency Department (HOSPITAL_COMMUNITY): Payer: BC Managed Care – PPO

## 2019-10-23 DIAGNOSIS — I77810 Thoracic aortic ectasia: Secondary | ICD-10-CM | POA: Diagnosis not present

## 2019-10-23 DIAGNOSIS — E785 Hyperlipidemia, unspecified: Secondary | ICD-10-CM | POA: Diagnosis present

## 2019-10-23 DIAGNOSIS — I252 Old myocardial infarction: Secondary | ICD-10-CM

## 2019-10-23 DIAGNOSIS — I2510 Atherosclerotic heart disease of native coronary artery without angina pectoris: Secondary | ICD-10-CM | POA: Diagnosis not present

## 2019-10-23 DIAGNOSIS — Z79899 Other long term (current) drug therapy: Secondary | ICD-10-CM

## 2019-10-23 DIAGNOSIS — N189 Chronic kidney disease, unspecified: Secondary | ICD-10-CM | POA: Diagnosis not present

## 2019-10-23 DIAGNOSIS — I25119 Atherosclerotic heart disease of native coronary artery with unspecified angina pectoris: Secondary | ICD-10-CM | POA: Diagnosis present

## 2019-10-23 DIAGNOSIS — E782 Mixed hyperlipidemia: Secondary | ICD-10-CM | POA: Diagnosis not present

## 2019-10-23 DIAGNOSIS — Z20822 Contact with and (suspected) exposure to covid-19: Secondary | ICD-10-CM | POA: Diagnosis present

## 2019-10-23 DIAGNOSIS — I214 Non-ST elevation (NSTEMI) myocardial infarction: Secondary | ICD-10-CM | POA: Diagnosis not present

## 2019-10-23 DIAGNOSIS — I129 Hypertensive chronic kidney disease with stage 1 through stage 4 chronic kidney disease, or unspecified chronic kidney disease: Secondary | ICD-10-CM | POA: Diagnosis not present

## 2019-10-23 DIAGNOSIS — Z9114 Patient's other noncompliance with medication regimen: Secondary | ICD-10-CM

## 2019-10-23 DIAGNOSIS — I472 Ventricular tachycardia: Secondary | ICD-10-CM | POA: Diagnosis present

## 2019-10-23 DIAGNOSIS — I10 Essential (primary) hypertension: Secondary | ICD-10-CM | POA: Diagnosis present

## 2019-10-23 DIAGNOSIS — I491 Atrial premature depolarization: Secondary | ICD-10-CM | POA: Diagnosis not present

## 2019-10-23 DIAGNOSIS — Z9861 Coronary angioplasty status: Secondary | ICD-10-CM

## 2019-10-23 DIAGNOSIS — Z8249 Family history of ischemic heart disease and other diseases of the circulatory system: Secondary | ICD-10-CM

## 2019-10-23 DIAGNOSIS — R0602 Shortness of breath: Secondary | ICD-10-CM | POA: Diagnosis not present

## 2019-10-23 DIAGNOSIS — R079 Chest pain, unspecified: Secondary | ICD-10-CM | POA: Diagnosis not present

## 2019-10-23 DIAGNOSIS — Z7982 Long term (current) use of aspirin: Secondary | ICD-10-CM

## 2019-10-23 DIAGNOSIS — R0789 Other chest pain: Secondary | ICD-10-CM | POA: Diagnosis not present

## 2019-10-23 LAB — BASIC METABOLIC PANEL
Anion gap: 15 (ref 5–15)
BUN: 19 mg/dL (ref 8–23)
CO2: 25 mmol/L (ref 22–32)
Calcium: 8.7 mg/dL — ABNORMAL LOW (ref 8.9–10.3)
Chloride: 101 mmol/L (ref 98–111)
Creatinine, Ser: 1.33 mg/dL — ABNORMAL HIGH (ref 0.61–1.24)
GFR calc Af Amer: >60 mL/min (ref >60)
GFR calc non Af Amer: 57 mL/min — ABNORMAL LOW (ref >60)
Glucose, Bld: 122 mg/dL — ABNORMAL HIGH (ref 70–99)
Potassium: 3.8 mmol/L (ref 3.5–5.1)
Sodium: 141 mmol/L (ref 135–145)

## 2019-10-23 LAB — TROPONIN I (HIGH SENSITIVITY)
Troponin I (High Sensitivity): 261 ng/L (ref <18)
Troponin I (High Sensitivity): 534 ng/L (ref <18)

## 2019-10-23 LAB — CBC
HCT: 50.4 % (ref 39.0–52.0)
Hemoglobin: 16.9 g/dL (ref 13.0–17.0)
MCH: 31.6 pg (ref 26.0–34.0)
MCHC: 33.5 g/dL (ref 30.0–36.0)
MCV: 94.4 fL (ref 80.0–100.0)
Platelets: 231 10*3/uL (ref 150–400)
RBC: 5.34 MIL/uL (ref 4.22–5.81)
RDW: 12.8 % (ref 11.5–15.5)
WBC: 6 10*3/uL (ref 4.0–10.5)
nRBC: 0 % (ref 0.0–0.2)

## 2019-10-23 MED ORDER — VITAMIN E 45 MG (100 UNIT) PO CAPS
1000.0000 [IU] | ORAL_CAPSULE | Freq: Every day | ORAL | Status: DC
  Filled 2019-10-23 (×2): qty 2

## 2019-10-23 MED ORDER — POLYVINYL ALCOHOL 1.4 % OP SOLN
Freq: Every day | OPHTHALMIC | Status: DC
  Filled 2019-10-23: qty 15

## 2019-10-23 MED ORDER — METOPROLOL TARTRATE 25 MG PO TABS
25.0000 mg | ORAL_TABLET | Freq: Two times a day (BID) | ORAL | Status: DC
  Administered 2019-10-23 – 2019-10-25 (×3): 25 mg via ORAL
  Filled 2019-10-23 (×3): qty 1

## 2019-10-23 MED ORDER — HEPARIN BOLUS VIA INFUSION
4000.0000 [IU] | Freq: Once | INTRAVENOUS | Status: AC
  Administered 2019-10-24: 4000 [IU] via INTRAVENOUS
  Filled 2019-10-23: qty 4000

## 2019-10-23 MED ORDER — CYCLOBENZAPRINE HCL 10 MG PO TABS: 10.0000 mg | ORAL_TABLET | Freq: Three times a day (TID) | ORAL | Status: DC | PRN

## 2019-10-23 MED ORDER — ATORVASTATIN CALCIUM 80 MG PO TABS: 80.0000 mg | ORAL_TABLET | Freq: Every day | ORAL | Status: DC

## 2019-10-23 MED ORDER — HEPARIN (PORCINE) 25000 UT/250ML-% IV SOLN
1200.0000 [IU]/h | INTRAVENOUS | Status: DC
  Administered 2019-10-24: 1200 [IU]/h via INTRAVENOUS
  Filled 2019-10-23: qty 250

## 2019-10-23 MED ORDER — NITROGLYCERIN 0.4 MG SL SUBL: 0.4000 mg | SUBLINGUAL_TABLET | SUBLINGUAL | Status: DC | PRN

## 2019-10-23 MED ORDER — LISDEXAMFETAMINE DIMESYLATE 50 MG PO CAPS: 100.0000 mg | ORAL_CAPSULE | Freq: Every day | ORAL | Status: DC

## 2019-10-23 MED ORDER — ASPIRIN EC 81 MG PO TBEC: 81.0000 mg | DELAYED_RELEASE_TABLET | Freq: Every day | ORAL | Status: DC

## 2019-10-23 MED ORDER — MORPHINE SULFATE (PF) 2 MG/ML IV SOLN: 2.0000 mg | INTRAVENOUS | Status: DC | PRN

## 2019-10-23 MED ORDER — ADULT MULTIVITAMIN W/MINERALS CH: 1.0000 | ORAL_TABLET | Freq: Every day | ORAL | Status: DC

## 2019-10-23 MED ORDER — ACETAMINOPHEN 325 MG PO TABS
650.0000 mg | ORAL_TABLET | ORAL | Status: DC | PRN
  Administered 2019-10-24 (×2): 650 mg via ORAL
  Filled 2019-10-23: qty 2

## 2019-10-23 MED ORDER — SODIUM CHLORIDE 0.9% FLUSH
3.0000 mL | Freq: Once | INTRAVENOUS | Status: AC
  Administered 2019-10-23: 3 mL via INTRAVENOUS

## 2019-10-23 MED ORDER — NITROGLYCERIN IN D5W 200-5 MCG/ML-% IV SOLN
0.0000 ug/min | INTRAVENOUS | Status: DC
  Administered 2019-10-23: 5 ug/min via INTRAVENOUS
  Filled 2019-10-23: qty 250

## 2019-10-23 MED ORDER — HEPARIN SODIUM (PORCINE) 5000 UNIT/ML IJ SOLN: 4000.0000 [IU] | Freq: Once | INTRAMUSCULAR | Status: DC

## 2019-10-23 MED ORDER — ONDANSETRON HCL 4 MG/2ML IJ SOLN: 4.0000 mg | Freq: Four times a day (QID) | INTRAMUSCULAR | Status: DC | PRN

## 2019-10-23 MED ORDER — MORPHINE SULFATE (PF) 4 MG/ML IV SOLN
4.0000 mg | Freq: Once | INTRAVENOUS | Status: AC
  Administered 2019-10-24: 4 mg via INTRAVENOUS
  Filled 2019-10-23: qty 1

## 2019-10-23 MED ORDER — LISINOPRIL 20 MG PO TABS
20.0000 mg | ORAL_TABLET | Freq: Every day | ORAL | Status: DC
  Administered 2019-10-25: 20 mg via ORAL
  Filled 2019-10-23: qty 1

## 2019-10-23 MED ORDER — TAMSULOSIN HCL 0.4 MG PO CAPS: 0.4000 mg | ORAL_CAPSULE | Freq: Every day | ORAL | Status: DC

## 2019-10-23 MED ORDER — NITROGLYCERIN IN D5W 200-5 MCG/ML-% IV SOLN
2.0000 ug/min | INTRAVENOUS | Status: DC
  Administered 2019-10-23: 25 ug/min via INTRAVENOUS
  Administered 2019-10-24: 5 ug/min via INTRAVENOUS
  Filled 2019-10-23: qty 250

## 2019-10-23 MED ORDER — HEPARIN (PORCINE) 25000 UT/250ML-% IV SOLN: 12.0000 [IU]/kg/h | INTRAVENOUS | Status: DC

## 2019-10-23 NOTE — H&P (Signed)
Cardiology Admission History and Physical:   Patient ID: Donald Calhoun MRN: [id number]; DOB: 1955/12/16   Admission date: 10/23/2019  Primary Care Provider: System, Provider Not In Primary Cardiologist: No primary care provider on file. C[place] (not seen since 2018) Primary Electrophysiologist:  None   Chief Complaint:  CP/NSTEMI  Patient Profile:   Donald Calhoun is a 64 y.o. 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  History of Present Illness:   [name] says he had onset of midsternal chest pressure/discomfort 2 days ago at rest.  The pain was severe, 10/10, and radiated to L arm. It was associated w/ SOB, diaphoresis, and nausea. Pt states it felt very similar to prior MI in 2014. He tells me the pain lasted several hours 2 days ago and finally got better w/ narcotic pain medications. He had recurrence of the pain today around 7pm with very similar sx. Given similarity to prior angina, he decided to come in to ED for evaluation (via EMS). He was given NTG by EMS w/ some improvement of sx. Initial EKG shows ST depression in the inferior leads, deeper than prior tracing. Initial HS trop was 261. ED has started heparin and NTG gtt given ongoing CP 5/10. Repeat EKG improved over prior;   Heart Pathway Score:     Past Medical History:  Diagnosis Date  . CAD (coronary artery disease)   . HTN (hypertension)   . Hyperlipidemia   . MI (myocardial infarction) (HCC) 2014    Past Surgical History:  Procedure Laterality Date  . ACHILLES TENDON REPAIR    . LEFT HEART CATHETERIZATION WITH CORONARY ANGIOGRAM N/A 05/22/2013   Procedure: LEFT HEART CATHETERIZATION WITH CORONARY ANGIOGRAM;  Surgeon: Robynn Pane, MD;  Location: [facility] CATH LAB;  Service: Cardiovascular;  Laterality: N/A;  . TONSILLECTOMY    .  WISDOM TOOTH EXTRACTION       Medications Prior to Admission: Prior to Admission medications   Medication Sig Start Date End Date Taking? Authorizing Provider  aspirin EC 81 MG EC tablet Take 1 tablet (81 mg total) by mouth daily. 05/24/13   Rinaldo Cloud, MD  atorvastatin (LIPITOR) 80 MG tablet TAKE 1 TABLET BY MOUTH EVERY DAY 05/27/17   Lewayne Bunting, MD  cyclobenzaprine (FLEXERIL) 10 MG tablet Take 1 tablet by mouth as needed. 08/13/15   [provider]  HYDROcodone-acetaminophen (NORCO/VICODIN) 5-325 MG tablet Take 1 tablet by mouth daily. 07/09/15   [provider]  lisinopril (PRINIVIL,ZESTRIL) 20 MG tablet TAKE 1 TABLET BY MOUTH EVERY DAY 11/09/16   Lewayne Bunting, MD  Multiple Vitamin (MULTIVITAMIN) tablet Take 1 tablet by mouth daily.    [provider]  Polyethyl Glycol-Propyl Glycol (SYSTANE OP) Place 1 drop into both eyes daily.    [provider]  tamsulosin (FLOMAX) 0.4 MG CAPS capsule Take 0.4 mg by mouth daily. 03/30/15   [provider]  testosterone cypionate (DEPOTESTOTERONE CYPIONATE) 200 MG/ML injection Inject 150 mg into the muscle once a week.  07/13/13   [provider]  vitamin E 1000 UNIT capsule Take 1,000 Units by mouth daily.    [provider]  VYVANSE 50 MG capsule Take 100 mg by mouth daily. 03/31/15   [provider]     Allergies:   No Known  Allergies  Social History:   Social History   Socioeconomic History  . Marital status: Single    Spouse name: Not on file  . Number of children: 4  . Years of education: Not on file  . Highest education level: Not on file  Occupational History    Employer: [employer]  Tobacco Use  . Smoking status: Never Smoker  . Smokeless tobacco: Never Used  Substance and Sexual Activity  . Alcohol use: Yes  . Drug use: No  . Sexual activity: Not on file  Other Topics Concern  . Not on file  Social History Narrative  . Not on file   Social  Determinants of Health   Financial Resource Strain:   . Difficulty of Paying Living Expenses: Not on file  Food Insecurity:   . Worried About Programme researcher, broadcasting/film/video in the Last Year: Not on file  . Ran Out of Food in the Last Year: Not on file  Transportation Needs:   . Lack of Transportation (Medical): Not on file  . Lack of Transportation (Non-Medical): Not on file  Physical Activity:   . Days of Exercise per Week: Not on file  . Minutes of Exercise per Session: Not on file  Stress:   . Feeling of Stress : Not on file  Social Connections:   . Frequency of Communication with Friends and Family: Not on file  . Frequency of Social Gatherings with Friends and Family: Not on file  . Attends Religious Services: Not on file  . Active Member of Clubs or Organizations: Not on file  . Attends Banker Meetings: Not on file  . Marital Status: Not on file  Intimate Partner Violence:   . Fear of Current or Ex-Partner: Not on file  . Emotionally Abused: Not on file  . Physically Abused: Not on file  . Sexually Abused: Not on file    Family History:   The patient's family history includes Heart disease in an other family member.    ROS:  Please see the history of present illness.  All other ROS reviewed and negative.     Physical Exam/Data:   Vitals:   10/23/19 2100 10/23/19 2107 10/23/19 2300 10/23/19 2301  BP:  (!) 170/94 (!) 176/95 (!) 153/94  Pulse:  64 68 69  Resp:  20 (!) 21 18  Temp:  98 F (36.7 C)    TempSrc:  Oral    SpO2: 96% 98% 96% 96%   No intake or output data in the 24 hours ending 10/23/19 2320 Last 3 Weights 10/21/2016 08/19/2015 04/22/2015  Weight (lbs) 246 lb 239 lb 14.4 oz 235 lb  Weight (kg) 111.585 kg 108.818 kg 106.595 kg     There is no height or weight on file to calculate BMI.  General:  Well nourished, well developed, in no acute distress HEENT: normal Lymph: no adenopathy Neck: no JVD Endocrine:  No thryomegaly Vascular: No carotid bruits;  DP pulses 2+ bilaterally   Cardiac:  normal S1, S2; RRR; no murmur  Lungs:  clear to auscultation bilaterally, no wheezing, rhonchi or rales  Abd: soft, nontender, no hepatomegaly  Ext: no edema Musculoskeletal:  No obvious deformities Skin: warm and dry  Neuro:  No gross focal neuro deficits Psych:  Normal affect    EKG:  The ECG that was done 10-23-19 was personally reviewed and demonstrates NSR with ST depression in the inferior leads, poor RWP (anteroseptal Q waves, similar to prior 2018) Questionable mild  ST elevation in I and aVL on initial EKG done by ED with very small Q's; repeat when I went to see pt shows no significant STE in the lateral leads although small Q's remain  Relevant CV Studies: August 2014 LHC showed 95-99% dLAD, 80-90% pD1, 40-50% OM4, 30-40% RCA  (pt is s/p PCI to LAD w/ DES at that time)  ejection fraction of 50-55%  Laboratory Data:  High Sensitivity Troponin:   Recent Labs  Lab 10/23/19 2121  TROPONINIHS 261*      Chemistry Recent Labs  Lab 10/23/19 2121  NA 141  K 3.8  CL 101  CO2 25  GLUCOSE 122*  BUN 19  CREATININE 1.33*  CALCIUM 8.7*  GFRNONAA 57*  GFRAA >60  ANIONGAP 15    No results for input(s): PROT, ALBUMIN, AST, ALT, ALKPHOS, BILITOT in the last 168 hours. Hematology Recent Labs  Lab 10/23/19 2121  WBC 6.0  RBC 5.34  HGB 16.9  HCT 50.4  MCV 94.4  MCH 31.6  MCHC 33.5  RDW 12.8  PLT 231   BNPNo results for input(s): BNP, PROBNP in the last 168 hours.  DDimer No results for input(s): DDIMER in the last 168 hours.   Radiology/Studies:  DG Chest 2 View  Result Date: 10/23/2019 CLINICAL DATA:  Chest pain EXAM: CHEST - 2 VIEW COMPARISON:  2016 FINDINGS: The heart size and mediastinal contours are within normal limits. Both lungs are clear. No pleural effusion. The visualized skeletal structures are unremarkable. IMPRESSION: No acute process in the chest. Electronically Signed   By: Macy Mis M.D.   On: 10/23/2019  21:24       TIMI Risk Score for Unstable Angina or Non-ST Elevation MI:   The patient's TIMI risk score is 6, which indicates a 41% risk of all cause mortality, new or recurrent myocardial infarction or need for urgent revascularization in the next 14 days.   Assessment and Plan:   1. Angina/NSTEMI: possibly missed lateral STEMI from 2 days prior. No significant ST elevation presently. I agree w/ addition of NTG to attempt to relieve pt's current discomfort. Can also try morphine PRN.  If pt becomes refractory to medical tx in regard to sx, may need LHC sooner rather than later. If we can cool things off from a symptom standpoing, plan for elective LHC in the AM. Cont heparin IV gtt, cycle HS trop. Will get TTE to reeval LVEF.  2. HTN/dyslipidemia: cont home regimen (on high-dose statin atorva 80 at home). Can titrate PRN. Get FLP; LDL goal is <70.  3. Risk factor assessment: pt's fasting BS is borderline; will check A1c for risk stratification  4. CKD: monitor Cr carefully w/ dye load  For questions or updates, please contact [place] Please consult [email or web address] for contact info under        Signed, Rudean Curt, MD, [facility]  10/23/2019 11:20 PM

## 2019-10-23 NOTE — ED Triage Notes (Signed)
Pt comes via [facility] for CP that started yesterday and returned today, with nausea, radiation to both arms, no SOB. PTA given one nitro and 324 ASA with some relief

## 2019-10-23 NOTE — Progress Notes (Signed)
ANTICOAGULATION CONSULT NOTE - Initial Consult  Pharmacy Consult for heparin Indication: chest pain/ACS   Patient Measurements: Heparin Dosing Weight:   Vital Signs: Temp: 98 F (36.7 C) (01/05 2107) Temp Source: Oral (01/05 2107) BP: 153/94 (01/05 2301) Pulse Rate: 69 (01/05 2301)  Labs: Recent Labs    10/23/19 2121  HGB 16.9  HCT 50.4  PLT 231  CREATININE 1.33*  TROPONINIHS 261*      Medical History: Past Medical History:  Diagnosis Date  . CAD (coronary artery disease)   . HTN (hypertension)   . Hyperlipidemia   . MI (myocardial infarction) (HCC) 2014     Assessment: 64 yo male admitted with chest pressure/discomfort. Troponin is elevated. CBC wnl.   Goal of Therapy:  Heparin level 0.3-0.7 units/ml Monitor platelets by anticoagulation protocol: Yes     Plan:  -Heparin bolus 4000 units x1 then 1200 units/hr -Daily HL, CBC    Lataisha Colan, Darl Householder 10/23/2019,11:16 PM

## 2019-10-23 NOTE — ED Provider Notes (Signed)
[facility] EMERGENCY DEPARTMENT Provider Note   CSN: [id number] Arrival date & time: 10/23/19  2058     History Chief Complaint  Patient presents with  . Chest Pain    Donald Calhoun is a 64 y.o. male.  Patient presents to the emergency department with a chief complaint of chest pain.  Has history of hypertension, hyperlipidemia, prior MI.  States that he began having chest pain about 7 PM tonight.  He describes pain is severe and central.  He reports associated radiating left arm pain, shortness of breath, and diaphoresis.  States that this feels similar to prior heart attack.  Took aspirin 324 mg prior to arrival.  Was given nitroglycerin by EMS with some improvement of symptoms.  Currently, rates his pain as a 5/10.  Chart Review  Cardiologist: Dr. Jens Som Last cardiac cath: 2014 - "Cardiac catheterization revealed an ejection fraction of 50-55%. There was a distal 95-99% LAD apical stenosis. The first diagonal had an 80-90% proximal lesion. A fourth marginal had a 40-50% lesion in the right coronary had a 30-40% lesion. The patient had PCI of the distal LAD with a drug-eluting stent." -Copied from Dr. Jens Som note 10/2016  The history is provided by the patient. No language interpreter was used.       Past Medical History:  Diagnosis Date  . CAD (coronary artery disease)   . HTN (hypertension)   . Hyperlipidemia   . MI (myocardial infarction) [facility]) 2014    Patient Active Problem List   Diagnosis Date Noted  . OSA (obstructive sleep apnea) 04/23/2014  . CAD (coronary artery disease) 07/27/2013  . Hyperlipidemia 07/27/2013  . HTN (hypertension)   . Hypokalemia   . Renal insufficiency     Past Surgical History:  Procedure Laterality Date  . ACHILLES TENDON REPAIR    . LEFT HEART CATHETERIZATION WITH CORONARY ANGIOGRAM N/A 05/22/2013   Procedure: LEFT HEART CATHETERIZATION WITH CORONARY ANGIOGRAM;  Surgeon: Robynn Pane, MD;  Location: [facility] CATH LAB;   Service: Cardiovascular;  Laterality: N/A;  . TONSILLECTOMY    . WISDOM TOOTH EXTRACTION         Family History  Problem Relation Age of Onset  . Heart disease Other        No family history    Social History   Tobacco Use  . Smoking status: Never Smoker  . Smokeless tobacco: Never Used  Substance Use Topics  . Alcohol use: Yes  . Drug use: No    Home Medications Prior to Admission medications   Medication Sig Start Date End Date Taking? Authorizing Provider  aspirin EC 81 MG EC tablet Take 1 tablet (81 mg total) by mouth daily. 05/24/13   Rinaldo Cloud, MD  atorvastatin (LIPITOR) 80 MG tablet TAKE 1 TABLET BY MOUTH EVERY DAY 05/27/17   Lewayne Bunting, MD  cyclobenzaprine (FLEXERIL) 10 MG tablet Take 1 tablet by mouth as needed. 08/13/15   [provider]  HYDROcodone-acetaminophen (NORCO/VICODIN) 5-325 MG tablet Take 1 tablet by mouth daily. 07/09/15   [provider]  lisinopril (PRINIVIL,ZESTRIL) 20 MG tablet TAKE 1 TABLET BY MOUTH EVERY DAY 11/09/16   Lewayne Bunting, MD  Multiple Vitamin (MULTIVITAMIN) tablet Take 1 tablet by mouth daily.    [provider]  Polyethyl Glycol-Propyl Glycol (SYSTANE OP) Place 1 drop into both eyes daily.    [provider]  tamsulosin (FLOMAX) 0.4 MG CAPS capsule Take 0.4 mg by mouth daily. 03/30/15   [provider]  testosterone cypionate (DEPOTESTOTERONE CYPIONATE) 200 MG/ML injection Inject 150 mg into the muscle once a week.  07/13/13   [provider]  vitamin E 1000 UNIT capsule Take 1,000 Units by mouth daily.    [provider]  VYVANSE 50 MG capsule Take 100 mg by mouth daily. 03/31/15   [provider]    Allergies    Patient has no known allergies.  Review of Systems   Review of Systems  All other systems reviewed and are negative.   Physical Exam Updated Vital Signs BP (!) 170/94 (BP Location: Right Arm)   Pulse 64   Temp 98 F (36.7 C) (Oral)    Resp 20   SpO2 98%   Physical Exam Vitals and nursing note reviewed.  Constitutional:      Appearance: He is well-developed.  HENT:     Head: Normocephalic and atraumatic.  Eyes:     Conjunctiva/sclera: Conjunctivae normal.  Cardiovascular:     Rate and Rhythm: Normal rate and regular rhythm.     Pulses: Normal pulses.     Heart sounds: No murmur.  Pulmonary:     Effort: Pulmonary effort is normal. No respiratory distress.     Breath sounds: Normal breath sounds.  Abdominal:     Palpations: Abdomen is soft.     Tenderness: There is no abdominal tenderness.  Musculoskeletal:        General: Normal range of motion.     Cervical back: Neck supple.  Skin:    General: Skin is warm and dry.  Neurological:     General: No focal deficit present.     Mental Status: He is alert and oriented to person, place, and time.  Psychiatric:        Mood and Affect: Mood normal.        Behavior: Behavior normal.     ED Results / Procedures / Treatments   Labs (all labs ordered are listed, but only abnormal results are displayed) Labs Reviewed  BASIC METABOLIC PANEL - Abnormal; Notable for the following components:      Result Value   Glucose, Bld 122 (*)    Creatinine, Ser 1.33 (*)    Calcium 8.7 (*)    GFR calc non Af Amer 57 (*)    All other components within normal limits  TROPONIN I (HIGH SENSITIVITY) - Abnormal; Notable for the following components:   Troponin I (High Sensitivity) 261 (*)    All other components within normal limits  RESPIRATORY PANEL BY RT PCR (FLU A&B, COVID)  CBC  CBG MONITORING, ED  TROPONIN I (HIGH SENSITIVITY)    EKG EKG Interpretation  Date/Time:  Tuesday October 23 2019 21:00:56 EST Ventricular Rate:  63 PR Interval:  170 QRS Duration: 90 QT Interval:  404 QTC Calculation: 413 R Axis:   115 Text Interpretation: Normal sinus rhythm Right axis deviation Anteroseptal infarct , age undetermined Marked ST abnormality, possible inferior  subendocardial injury , increased compared to last tracing Abnormal ECG Confirmed by Dorie Rank [phone]) on 10/23/2019 10:41:16 PM   Radiology DG Chest 2 View  Result Date: 10/23/2019 CLINICAL DATA:  Chest pain EXAM: CHEST - 2 VIEW COMPARISON:  2016 FINDINGS: The heart size and mediastinal contours are within normal limits. Both lungs are clear. No pleural effusion. The visualized skeletal structures are unremarkable. IMPRESSION: No acute process in the chest. Electronically Signed   By: Macy Mis M.D.   On: 10/23/2019 21:24    Procedures .Critical  Care Performed by: Roxy Horseman, PA-C Authorized by: Roxy Horseman, PA-C   Critical care provider statement:    Critical care time (minutes):  32   Critical care was necessary to treat or prevent imminent or life-threatening deterioration of the following conditions:  Circulatory failure   Critical care was time spent personally by me on the following activities:  Discussions with consultants, evaluation of patient's response to treatment, examination of patient, ordering and performing treatments and interventions, ordering and review of laboratory studies, ordering and review of radiographic studies, pulse oximetry, re-evaluation of patient's condition, obtaining history from patient or surrogate and review of old charts   (including critical care time)  Medications Ordered in ED Medications  morphine 4 MG/ML injection 4 mg (has no administration in time range)  heparin ADULT infusion 100 units/mL (25000 units/282mL sodium chloride 0.45%) (has no administration in time range)  heparin bolus via infusion 4,000 Units (has no administration in time range)  aspirin EC tablet 81 mg (has no administration in time range)  nitroGLYCERIN (NITROSTAT) SL tablet 0.4 mg (has no administration in time range)  acetaminophen (TYLENOL) tablet 650 mg (has no administration in time range)  ondansetron (ZOFRAN) injection 4 mg (has no administration in  time range)  metoprolol tartrate (LOPRESSOR) tablet 25 mg (has no administration in time range)  atorvastatin (LIPITOR) tablet 80 mg (has no administration in time range)  nitroGLYCERIN 50 mg in dextrose 5 % 250 mL (0.2 mg/mL) infusion (has no administration in time range)  cyclobenzaprine (FLEXERIL) tablet 10 mg (has no administration in time range)  lisinopril (ZESTRIL) tablet 20 mg (has no administration in time range)  multivitamin with minerals tablet 1 tablet (has no administration in time range)  polyvinyl alcohol (LIQUIFILM TEARS) 1.4 % ophthalmic solution (has no administration in time range)  tamsulosin (FLOMAX) capsule 0.4 mg (has no administration in time range)  vitamin E capsule 1,000 Units (has no administration in time range)  lisdexamfetamine (VYVANSE) capsule 100 mg (has no administration in time range)  morphine 2 MG/ML injection 2 mg (has no administration in time range)  sodium chloride flush (NS) 0.9 % injection 3 mL (3 mLs Intravenous Given 10/23/19 2258)    ED Course  I have reviewed the triage vital signs and the nursing notes.  Pertinent labs & imaging results that were available during my care of the patient were reviewed by me and considered in my medical decision making (see chart for details).    MDM Rules/Calculators/A&P                      Patient with chest pain that started about 7 PM this evening.  Has radiating arm pain, shortness of breath, and had diaphoresis earlier.  Took 324 mg of aspirin.  Was given some nitroglycerin by EMS with some improvement.  History of prior MI and prior stent.  Cardiologist is Dr. Jens Som.  Pain currently a 5 out of 10.  Troponin is 261.  EKG shows worsening ST depressions when compared to priors.  Will start heparin and nitroglycerin infusions.  Will consult cardiology for admission.  11:09 PM Appreciate Dr. Daphine Deutscher, cardiology, for admitting the patient.  Requests repeat EKG.  11:23 PM Patient reassessed and updated.   Pain is worsening.  Titrating up nitro.  Morphine ordered.  Patient seen by and discussed with Dr. Lynelle Doctor.  Final Clinical Impression(s) / ED Diagnoses Final diagnoses:  NSTEMI (non-ST elevated myocardial infarction) (HCC)    Rx / DC  Orders ED Discharge Orders    None       Felipa Furnace 10/23/19 2338    Linwood Dibbles, MD 10/24/19 [phone]

## 2019-10-24 ENCOUNTER — Inpatient Hospital Stay (HOSPITAL_COMMUNITY): Payer: BC Managed Care – PPO

## 2019-10-24 ENCOUNTER — Inpatient Hospital Stay (HOSPITAL_COMMUNITY)
Admission: EM | Disposition: A | Discharge status: Home / Self Care | Payer: Self-pay | Source: Home / Self Care | Attending: Cardiovascular Disease

## 2019-10-24 ENCOUNTER — Other Ambulatory Visit: Payer: Self-pay

## 2019-10-24 DIAGNOSIS — I77810 Thoracic aortic ectasia: Secondary | ICD-10-CM | POA: Diagnosis present

## 2019-10-24 DIAGNOSIS — I129 Hypertensive chronic kidney disease with stage 1 through stage 4 chronic kidney disease, or unspecified chronic kidney disease: Secondary | ICD-10-CM | POA: Diagnosis not present

## 2019-10-24 DIAGNOSIS — I2510 Atherosclerotic heart disease of native coronary artery without angina pectoris: Secondary | ICD-10-CM

## 2019-10-24 DIAGNOSIS — Z79899 Other long term (current) drug therapy: Secondary | ICD-10-CM | POA: Diagnosis not present

## 2019-10-24 DIAGNOSIS — Z9114 Patient's other noncompliance with medication regimen: Secondary | ICD-10-CM | POA: Diagnosis not present

## 2019-10-24 DIAGNOSIS — I252 Old myocardial infarction: Secondary | ICD-10-CM | POA: Diagnosis not present

## 2019-10-24 DIAGNOSIS — Z20822 Contact with and (suspected) exposure to covid-19: Secondary | ICD-10-CM | POA: Diagnosis present

## 2019-10-24 DIAGNOSIS — E782 Mixed hyperlipidemia: Secondary | ICD-10-CM | POA: Diagnosis not present

## 2019-10-24 DIAGNOSIS — I10 Essential (primary) hypertension: Secondary | ICD-10-CM

## 2019-10-24 DIAGNOSIS — I214 Non-ST elevation (NSTEMI) myocardial infarction: Secondary | ICD-10-CM

## 2019-10-24 DIAGNOSIS — Z7982 Long term (current) use of aspirin: Secondary | ICD-10-CM | POA: Diagnosis not present

## 2019-10-24 DIAGNOSIS — N189 Chronic kidney disease, unspecified: Secondary | ICD-10-CM | POA: Diagnosis present

## 2019-10-24 DIAGNOSIS — E785 Hyperlipidemia, unspecified: Secondary | ICD-10-CM | POA: Diagnosis present

## 2019-10-24 DIAGNOSIS — I472 Ventricular tachycardia: Secondary | ICD-10-CM | POA: Diagnosis present

## 2019-10-24 DIAGNOSIS — Z9861 Coronary angioplasty status: Secondary | ICD-10-CM | POA: Diagnosis not present

## 2019-10-24 DIAGNOSIS — Z8249 Family history of ischemic heart disease and other diseases of the circulatory system: Secondary | ICD-10-CM | POA: Diagnosis not present

## 2019-10-24 DIAGNOSIS — I25119 Atherosclerotic heart disease of native coronary artery with unspecified angina pectoris: Secondary | ICD-10-CM | POA: Diagnosis present

## 2019-10-24 HISTORY — PX: CORONARY BALLOON ANGIOPLASTY: CATH118233

## 2019-10-24 HISTORY — PX: LEFT HEART CATH AND CORONARY ANGIOGRAPHY: CATH118249

## 2019-10-24 HISTORY — PX: INTRAVASCULAR PRESSURE WIRE/FFR STUDY: CATH118243

## 2019-10-24 LAB — RESPIRATORY PANEL BY RT PCR (FLU A&B, COVID)
Influenza A by PCR: NEGATIVE
Influenza B by PCR: NEGATIVE
SARS Coronavirus 2 by RT PCR: NEGATIVE

## 2019-10-24 LAB — CBC
HCT: 45.2 % (ref 39.0–52.0)
Hemoglobin: 15.3 g/dL (ref 13.0–17.0)
MCH: 32 pg (ref 26.0–34.0)
MCHC: 33.8 g/dL (ref 30.0–36.0)
MCV: 94.6 fL (ref 80.0–100.0)
Platelets: 214 10*3/uL (ref 150–400)
RBC: 4.78 MIL/uL (ref 4.22–5.81)
RDW: 12.8 % (ref 11.5–15.5)
WBC: 8.4 10*3/uL (ref 4.0–10.5)
nRBC: 0 % (ref 0.0–0.2)

## 2019-10-24 LAB — BASIC METABOLIC PANEL
Anion gap: 11 (ref 5–15)
BUN: 20 mg/dL (ref 8–23)
CO2: 26 mmol/L (ref 22–32)
Calcium: 8.7 mg/dL — ABNORMAL LOW (ref 8.9–10.3)
Chloride: 104 mmol/L (ref 98–111)
Creatinine, Ser: 1.32 mg/dL — ABNORMAL HIGH (ref 0.61–1.24)
GFR calc Af Amer: >60 mL/min (ref >60)
GFR calc non Af Amer: 58 mL/min — ABNORMAL LOW (ref >60)
Glucose, Bld: 118 mg/dL — ABNORMAL HIGH (ref 70–99)
Potassium: 3.5 mmol/L (ref 3.5–5.1)
Sodium: 141 mmol/L (ref 135–145)

## 2019-10-24 LAB — HEMOGLOBIN A1C
Hgb A1c MFr Bld: 5.4 % (ref 4.8–5.6)
Mean Plasma Glucose: 108.28 mg/dL

## 2019-10-24 LAB — ECHOCARDIOGRAM COMPLETE

## 2019-10-24 LAB — TROPONIN I (HIGH SENSITIVITY)
Troponin I (High Sensitivity): 939 ng/L (ref <18)
Troponin I (High Sensitivity): 11183 ng/L (ref <18)
Troponin I (High Sensitivity): 17601 ng/L (ref <18)

## 2019-10-24 LAB — PROTIME-INR
INR: 1.1 (ref 0.8–1.2)
Prothrombin Time: 13.9 seconds (ref 11.4–15.2)

## 2019-10-24 LAB — LIPID PANEL
Cholesterol: 176 mg/dL (ref 0–200)
HDL: 49 mg/dL (ref >40)
LDL Cholesterol: 100 mg/dL — ABNORMAL HIGH (ref 0–99)
Total CHOL/HDL Ratio: 3.6 RATIO
Triglycerides: 134 mg/dL (ref <150)
VLDL: 27 mg/dL (ref 0–40)

## 2019-10-24 LAB — HEPARIN LEVEL (UNFRACTIONATED): Heparin Unfractionated: 0.2 IU/mL — ABNORMAL LOW (ref 0.30–0.70)

## 2019-10-24 LAB — CBG MONITORING, ED: Glucose-Capillary: 115 mg/dL — ABNORMAL HIGH (ref 70–99)

## 2019-10-24 LAB — HIV ANTIBODY (ROUTINE TESTING W REFLEX): HIV Screen 4th Generation wRfx: NONREACTIVE

## 2019-10-24 LAB — TSH: TSH: 1.255 u[IU]/mL (ref 0.350–4.500)

## 2019-10-24 SURGERY — CORONARY BALLOON ANGIOPLASTY
Anesthesia: LOCAL

## 2019-10-24 MED ORDER — HYDRALAZINE HCL 20 MG/ML IJ SOLN: 10.0000 mg | INTRAMUSCULAR | Status: AC | PRN

## 2019-10-24 MED ORDER — SODIUM CHLORIDE 0.9 % WEIGHT BASED INFUSION
3.0000 mL/kg/h | INTRAVENOUS | Status: AC
  Administered 2019-10-25: 3 mL/kg/h via INTRAVENOUS

## 2019-10-24 MED ORDER — LIDOCAINE HCL (PF) 1 % IJ SOLN
INTRAMUSCULAR | Status: AC
  Filled 2019-10-24: qty 30

## 2019-10-24 MED ORDER — TICAGRELOR 90 MG PO TABS
ORAL_TABLET | ORAL | Status: DC | PRN
  Administered 2019-10-24: 180 mg via ORAL

## 2019-10-24 MED ORDER — NITROGLYCERIN 1 MG/10 ML FOR IR/CATH LAB
INTRA_ARTERIAL | Status: DC | PRN
  Administered 2019-10-24: 200 ug via INTRACORONARY

## 2019-10-24 MED ORDER — SODIUM CHLORIDE 0.9% FLUSH: 3.0000 mL | INTRAVENOUS | Status: DC | PRN

## 2019-10-24 MED ORDER — IOHEXOL 350 MG/ML SOLN
INTRAVENOUS | Status: DC | PRN
  Administered 2019-10-24: 08:00:00 160 mL via INTRA_ARTERIAL

## 2019-10-24 MED ORDER — TICAGRELOR 90 MG PO TABS
ORAL_TABLET | ORAL | Status: AC
  Filled 2019-10-24: qty 2

## 2019-10-24 MED ORDER — BIVALIRUDIN TRIFLUOROACETATE 250 MG IV SOLR
INTRAVENOUS | Status: AC
  Filled 2019-10-24: qty 250

## 2019-10-24 MED ORDER — LISDEXAMFETAMINE DIMESYLATE 50 MG PO CAPS: 100.0000 mg | ORAL_CAPSULE | Freq: Every day | ORAL | Status: DC

## 2019-10-24 MED ORDER — ATORVASTATIN CALCIUM 80 MG PO TABS: 80.0000 mg | ORAL_TABLET | Freq: Every day | ORAL | Status: DC

## 2019-10-24 MED ORDER — SODIUM CHLORIDE 0.9 % IV SOLN
INTRAVENOUS | Status: AC | PRN
  Administered 2019-10-24: 20 mL/h via INTRAVENOUS

## 2019-10-24 MED ORDER — SODIUM CHLORIDE 0.9 % WEIGHT BASED INFUSION
1.0000 mL/kg/h | INTRAVENOUS | Status: DC
  Administered 2019-10-25: 1 mL/kg/h via INTRAVENOUS

## 2019-10-24 MED ORDER — NITROGLYCERIN 1 MG/10 ML FOR IR/CATH LAB
INTRA_ARTERIAL | Status: AC
  Filled 2019-10-24: qty 10

## 2019-10-24 MED ORDER — SODIUM CHLORIDE 0.9% FLUSH: 3.0000 mL | Freq: Two times a day (BID) | INTRAVENOUS | Status: DC

## 2019-10-24 MED ORDER — SODIUM CHLORIDE 0.9 % IV SOLN
INTRAVENOUS | Status: DC | PRN
  Administered 2019-10-24 (×2): 1.75 mg/kg/h via INTRAVENOUS

## 2019-10-24 MED ORDER — VERAPAMIL HCL 2.5 MG/ML IV SOLN
INTRAVENOUS | Status: AC
  Filled 2019-10-24: qty 2

## 2019-10-24 MED ORDER — SODIUM CHLORIDE 0.9 % IV SOLN: 250.0000 mL | INTRAVENOUS | Status: DC | PRN

## 2019-10-24 MED ORDER — ASPIRIN 81 MG PO CHEW
81.0000 mg | CHEWABLE_TABLET | ORAL | Status: AC
  Administered 2019-10-25: 81 mg via ORAL
  Filled 2019-10-24: qty 1

## 2019-10-24 MED ORDER — HEPARIN (PORCINE) IN NACL 1000-0.9 UT/500ML-% IV SOLN
INTRAVENOUS | Status: AC
  Filled 2019-10-24: qty 1000

## 2019-10-24 MED ORDER — LISDEXAMFETAMINE DIMESYLATE 30 MG PO CAPS: 30.0000 mg | ORAL_CAPSULE | Freq: Every day | ORAL | Status: DC

## 2019-10-24 MED ORDER — LABETALOL HCL 5 MG/ML IV SOLN: 10.0000 mg | INTRAVENOUS | Status: AC | PRN

## 2019-10-24 MED ORDER — ASPIRIN 81 MG PO CHEW: 81.0000 mg | CHEWABLE_TABLET | Freq: Every day | ORAL | Status: DC

## 2019-10-24 MED ORDER — SODIUM CHLORIDE 0.9% FLUSH
3.0000 mL | Freq: Two times a day (BID) | INTRAVENOUS | Status: DC
  Administered 2019-10-24: 3 mL via INTRAVENOUS

## 2019-10-24 MED ORDER — HEPARIN (PORCINE) IN NACL 1000-0.9 UT/500ML-% IV SOLN
INTRAVENOUS | Status: DC | PRN
  Administered 2019-10-24 (×2): 500 mL

## 2019-10-24 MED ORDER — ACETAMINOPHEN 325 MG PO TABS: 650.0000 mg | ORAL_TABLET | ORAL | Status: DC | PRN

## 2019-10-24 MED ORDER — BIVALIRUDIN BOLUS VIA INFUSION - CUPID
INTRAVENOUS | Status: DC | PRN
  Administered 2019-10-24: 07:00:00 81.75 mg via INTRAVENOUS

## 2019-10-24 MED ORDER — SODIUM CHLORIDE 0.9 % IV SOLN: INTRAVENOUS | Status: AC

## 2019-10-24 MED ORDER — ACETAMINOPHEN 325 MG PO TABS
ORAL_TABLET | ORAL | Status: AC
  Filled 2019-10-24: qty 2

## 2019-10-24 MED ORDER — ONDANSETRON HCL 4 MG/2ML IJ SOLN: 4.0000 mg | Freq: Four times a day (QID) | INTRAMUSCULAR | Status: DC | PRN

## 2019-10-24 MED ORDER — LISDEXAMFETAMINE DIMESYLATE 70 MG PO CAPS: 70.0000 mg | ORAL_CAPSULE | Freq: Every day | ORAL | Status: DC

## 2019-10-24 MED ORDER — TICAGRELOR 90 MG PO TABS
90.0000 mg | ORAL_TABLET | Freq: Two times a day (BID) | ORAL | Status: DC
  Administered 2019-10-24 – 2019-10-25 (×2): 90 mg via ORAL
  Filled 2019-10-24 (×2): qty 1

## 2019-10-24 MED ORDER — LIDOCAINE HCL (PF) 1 % IJ SOLN
INTRAMUSCULAR | Status: DC | PRN
  Administered 2019-10-24: 15 mL

## 2019-10-24 MED ORDER — MORPHINE SULFATE (PF) 2 MG/ML IV SOLN: 2.0000 mg | INTRAVENOUS | Status: DC | PRN

## 2019-10-24 SURGICAL SUPPLY — 15 items
BALLN SAPPHIRE 1.5X12 (BALLOONS) ×2
BALLOON SAPPHIRE 1.5X12 (BALLOONS) IMPLANT
CATH DXT MULTI JL4 JR4 ANG PIG (CATHETERS) ×1 IMPLANT
CATH VISTA GUIDE 6FR XBLAD3.5 (CATHETERS) ×2 IMPLANT
CATH VISTA GUIDE 6FR XBLAD4 (CATHETERS) ×1 IMPLANT
GUIDEWIRE PRESSURE COMET II (WIRE) ×1 IMPLANT
KIT ENCORE 26 ADVANTAGE (KITS) ×1 IMPLANT
KIT HEART LEFT (KITS) ×2 IMPLANT
PACK CARDIAC CATHETERIZATION (CUSTOM PROCEDURE TRAY) ×2 IMPLANT
SHEATH PINNACLE 6F 10CM (SHEATH) ×1 IMPLANT
SYR MEDRAD MARK 7 150ML (SYRINGE) ×2 IMPLANT
TRANSDUCER W/STOPCOCK (MISCELLANEOUS) ×2 IMPLANT
TUBING CIL FLEX 10 FLL-RA (TUBING) ×2 IMPLANT
WIRE ASAHI PROWATER 180CM (WIRE) ×1 IMPLANT
WIRE EMERALD 3MM-J .035X150CM (WIRE) ×1 IMPLANT

## 2019-10-24 NOTE — Plan of Care (Signed)
  Problem: Pain Managment: Goal: General experience of comfort will improve Outcome: Progressing   Problem: Safety: Goal: Ability to remain free from injury will improve Outcome: Progressing   

## 2019-10-24 NOTE — ED Notes (Signed)
Zoll pads applied for cath lab

## 2019-10-24 NOTE — Significant Event (Signed)
Update: called patient to discuss cath scheduling with patient and go over procedure risks/benefits. He initially indicated that he does not wish to proceed tomorrow. He stated he does not want to stay in the hospital long due to Covid, would prefer only to have it done through the arm, would consider having it done by a different provider, and may leave to go have this done in [place]. He indicates he'd prefer to take a break before having another heart cath. Reviewed with Dr. Allyson Sabal and Dr. Delton See. Both agree he is not medically fit for discharge today given that he was admitted just today for myocardial infarction. Dr. Allyson Sabal stated he would be willing to go radially tomorrow if the patient wished to have the procedure. I spoke with the patient again who does not wish to have Dr. Allyson Sabal do the procedure. He requested to know who the best cardiologist was. I told him we feel our whole interventional team is extremely capable. He was initially dissatsified with this answer. He is willing to have radial cath done with another provider. I relayed that Dr. [place] is also in the lab tomorrow. He is agreeable to proceeding. Cath lab updated, procedure scheduled for first available with Dr. [place] at 10:30am. Risks and benefits of cardiac catheterization have been discussed with the patient. These include bleeding, infection, kidney damage, stroke, heart attack, death. The patient understands these risks and is willing to proceed. Pre-cath orders written.  Ciera Beckum PA-C

## 2019-10-24 NOTE — Progress Notes (Signed)
Spoke w/ interventional cardiology on-call Dr. Allyson Sabal given pt's ongoing pain (recently 10/10) and mild STE on initial EKG. NTG gtt currently at .  Pt does not appear to be in acute distress although he does c/o ongoing pain. Will cont to up-titrate NTG and use morphine PRN to relieve sx. Given resolution of ST elevation, Dr. Allyson Sabal would like to try to hold off on LHC until the AM. Will reassess in 45-60 min to see if pain has improved. Precious Reel, MD , [facility] 10/24/19 1:04 AM

## 2019-10-24 NOTE — Interval H&P Note (Signed)
Cath Lab Visit (complete for each Cath Lab visit)  Clinical Evaluation Leading to the Procedure:   ACS: Yes.    Non-ACS:    Anginal Classification: CCS III  Anti-ischemic medical therapy: Maximal Therapy (2 or more classes of medications)  Non-Invasive Test Results: No non-invasive testing performed  Prior CABG: No previous CABG      History and Physical Interval Note:  10/24/2019 6:34 AM  Donald Calhoun  has presented today for surgery, with the diagnosis of STEMI.  The various methods of treatment have been discussed with the patient and family. After consideration of risks, benefits and other options for treatment, the patient has consented to  Procedure(s): Coronary/Graft Acute MI Revascularization (N/A) LEFT HEART CATH AND CORONARY ANGIOGRAPHY (N/A) as a surgical intervention.  The patient's history has been reviewed, patient examined, no change in status, stable for surgery.  I have reviewed the patient's chart and labs.  Questions were answered to the patient's satisfaction.     Nanetta Batty

## 2019-10-24 NOTE — ED Notes (Signed)
Pt speaking to Dr.Martin at bedside; cath team activated

## 2019-10-24 NOTE — H&P (View-Only) (Signed)
Spoke w/ interventional cardiology on-call Dr. Berry given pt's ongoing pain (recently 10/10) and mild STE on initial EKG. NTG gtt currently at 60mcg.  Pt does not appear to be in acute distress although he does c/o ongoing pain. Will cont to up-titrate NTG and use morphine PRN to relieve sx. Given resolution of ST elevation, Dr. Berry would like to try to hold off on LHC until the AM. Will reassess in 45-60 min to see if pain has improved. Shizue Kaseman Falk Dejanae Helser, MD , FACC 10/24/19 1:04 AM   

## 2019-10-24 NOTE — ED Notes (Addendum)
Pt reports pain level unchanged (10/10, pressure in center of chest), but currently comfortable in bed watching tv. This RN advised that pt keep staff informed of any changing level or condition of pain. Will continue to monitor and inform Cardiology team of any changes in pt condition

## 2019-10-24 NOTE — Progress Notes (Addendum)
Dr. Delton See saw patient this AM - she discussed case with Dr. Allyson Sabal re: timing of staged PCI. Originally was recommended to be Friday but per update, recommended for tomorrow afternoon with Dr. Allyson Sabal instead. Will write precath orders. Plan to discuss with patient when he is out of holding area. Kadija Cruzen PA-C

## 2019-10-24 NOTE — ED Notes (Signed)
Cath lab called advised they are ready for patient--told rn Brittany--Fabiano Ginley

## 2019-10-24 NOTE — Significant Event (Signed)
Notified by ED RN that pt had 21-beat run of NSVT; went to see pt and strips show what appears to be consistent with VT. Pt cont to c/o CP through the night, although he has also slept intermittently when his nurse checks on him. he is now refusing morphine and states "I will just lie here in pain." I spoke again w/ Dr. Allyson Sabal and he is willing to activate the cath lab at this time for urgent LHC.  Precious Reel, MD , [facility] 10/24/19 5:48 AM

## 2019-10-24 NOTE — ED Notes (Signed)
Pt had a 21 run of vtach while asleep. Pt reported he woke up with monitor ringing rate at 170 and having increased pain. Rhythm strip printed and at bedside. Informed Dr.Martin of vtach. Pt also very upset concerning wait for cardiac cath procedure; persistent in "suing the doctor". Nitro gtt increased, offered morphine for pain, pt declined at present.

## 2019-10-24 NOTE — Progress Notes (Signed)
  Echocardiogram 2D Echocardiogram has been performed.  Phylis Javed G Trenia Tennyson 10/24/2019, 2:03 PM

## 2019-10-24 NOTE — Progress Notes (Signed)
Site area: right groin  Site Prior to Removal:  Level 0  Pressure Applied For 20 MINUTES    Minutes Beginning at 1015  Manual:   Yes.    Patient Status During Pull:  Stable  Post Pull Groin Site:  Level 0  Post Pull Instructions Given:  Yes.    Post Pull Pulses Present:  Yes.    Dressing Applied:  yes  Comments:  Bed rest started at 1030 X 4 hr.

## 2019-10-24 NOTE — Progress Notes (Signed)
Progress Note  Patient Name: Dannis Deroche Date of Encounter: 10/24/2019  Primary Cardiologist: Dr Allyson Sabal  Subjective   The patient denies any chest pain or shortness of breath this morning.  Inpatient Medications    Scheduled Meds: . [MAR Hold] aspirin EC  81 mg Oral Daily  . [MAR Hold] atorvastatin  80 mg Oral q1800  . atorvastatin  80 mg Oral q1800  . [MAR Hold] lisdexamfetamine  100 mg Oral Daily  . [MAR Hold] lisinopril  20 mg Oral Daily  . [MAR Hold] metoprolol tartrate  25 mg Oral BID  . [MAR Hold] multivitamin with minerals  1 tablet Oral Daily  . [MAR Hold] polyvinyl alcohol   Both Eyes Daily  . [MAR Hold] tamsulosin  0.4 mg Oral Daily  . ticagrelor  90 mg Oral BID  . [MAR Hold] vitamin E  1,000 Units Oral Daily   Continuous Infusions: . sodium chloride 75 mL/hr at 10/24/19 0823  . [MAR Hold] nitroGLYCERIN 20 mcg/min (10/24/19 0845)   PRN Meds: [MAR Hold] acetaminophen, [MAR Hold] cyclobenzaprine, [MAR Hold]  morphine injection, [MAR Hold] nitroGLYCERIN, [MAR Hold] ondansetron (ZOFRAN) IV   Vital Signs    Vitals:   10/24/19 0743 10/24/19 0745 10/24/19 0749 10/24/19 0809  BP: 115/75 120/82 125/79 131/70  Pulse: (!) 48 60 (!) 58 (!) 55  Resp: 16 14 10 16   Temp:      TempSrc:      SpO2: 94% 93% 100%     Intake/Output Summary (Last 24 hours) at 10/24/2019 1039 Last data filed at 10/24/2019 0923 Gross per 24 hour  Intake --  Output 500 ml  Net -500 ml   Last 3 Weights 10/21/2016 08/19/2015 04/22/2015  Weight (lbs) 246 lb 239 lb 14.4 oz 235 lb  Weight (kg) 111.585 kg 108.818 kg 106.595 kg      Telemetry    Sinus rhythm- Personally Reviewed  ECG    Sinus rhythm with negative T waves in the inferior leads unchanged from prior, Q wave in V1 through V3 unchanged from prior- Personally Reviewed  Physical Exam   GEN: No acute distress.   Neck: No JVD Cardiac: RRR, no murmurs, rubs, or gallops.  Respiratory: Clear to auscultation bilaterally. GI: Soft,  nontender, non-distended  MS: No edema; No deformity. Neuro:  Nonfocal  Psych: Normal affect   Labs    High Sensitivity Troponin:   Recent Labs  Lab 10/23/19 2121 10/23/19 2302 10/24/19 0008 10/24/19 0509  TROPONINIHS 261* 534* 939* 11,183*      Chemistry Recent Labs  Lab 10/23/19 2121 10/24/19 0509  NA 141 141  K 3.8 3.5  CL 101 104  CO2 25 26  GLUCOSE 122* 118*  BUN 19 20  CREATININE 1.33* 1.32*  CALCIUM 8.7* 8.7*  GFRNONAA 57* 58*  GFRAA >60 >60  ANIONGAP 15 11     Hematology Recent Labs  Lab 10/23/19 2121 10/24/19 0509  WBC 6.0 8.4  RBC 5.34 4.78  HGB 16.9 15.3  HCT 50.4 45.2  MCV 94.4 94.6  MCH 31.6 32.0  MCHC 33.5 33.8  RDW 12.8 12.8  PLT 231 214    BNPNo results for input(s): BNP, PROBNP in the last 168 hours.   DDimer No results for input(s): DDIMER in the last 168 hours.   Radiology    DG Chest 2 View  Result Date: 10/23/2019 CLINICAL DATA:  Chest pain EXAM: CHEST - 2 VIEW COMPARISON:  2016 FINDINGS: The heart size and mediastinal contours are within normal  limits. Both lungs are clear. No pleural effusion. The visualized skeletal structures are unremarkable. IMPRESSION: No acute process in the chest. Electronically Signed   By: Guadlupe Spanish M.D.   On: 10/23/2019 21:24   CARDIAC CATHETERIZATION  Result Date: 10/24/2019  RPAV lesion is 75% stenosed.  Prox RCA lesion is 60% stenosed.  Previously placed Dist LAD stent (unknown type) is widely patent.  Prox LAD to Mid LAD lesion is 60% stenosed.  1st Diag-1 lesion is 99% stenosed.  1st Diag-2 lesion is 99% stenosed.  Post intervention, there is a 25% residual stenosis.  Post intervention, there is a 80% residual stenosis.  There is mild left ventricular systolic dysfunction.  LV end diastolic pressure is normal.  The left ventricular ejection fraction is 50-55% by visual estimate.  Daryle Amis is a 64 y.o. male  [id number] LOCATION:  FACILITY: [facility] PHYSICIAN: Nanetta Batty, M.D.  [birth year] DATE OF PROCEDURE:  10/24/2019 DATE OF DISCHARGE: CARDIAC CATHETERIZATION / PCI Diag 1 History obtained from chart review.Jaremy Nosal is a 64 y.o. 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  He did have an episode of slow nonsustained VT versus AI VR at 430 this morning.  Troponins rose to 11,000. PROCEDURE DESCRIPTION: The patient was brought to the second floor [facility] cath lab in the postabsorptive state. He was not premedicated . His right groin was prepped and shaved in usual sterile fashion. Xylocaine 1% was used for local anesthesia. A 6 French sheath was inserted into the right common femoral  artery using standard Seldinger technique.  5 French right left Judkins diagnostic catheters (Patel catheter used for selective coronary angiography left ventriculography respectively.  Isovue dye was used for the entirety of the case.  Retrograde aorta, ventricular and pullback pressures were recorded. Infarct-related artery was a small first diagonal branch.  Patient did have moderate disease in his proximal LAD, proximal codominant right and PLA system.  He received Angiomax bolus followed by infusion with a therapeutic ACT.  He received Brilinta 180 mg p.o. loading dose.  Using an XB LAD 4.0 cm guide catheter along with a 0.14 comet DFR wire across the proximal LAD revealing a DFR morning of 0.95 suggesting that the proximal LAD lesion was not physiologically significant. I then focused my attention on the infarct-related artery, the first diagonal branch.  I crossed the proximal subtotally occluded diagonal branch with a 0.14 Prowater wire and performed POBA with a 1.5 mm x 12 mm long balloon of the proximal and distal lesion.  I did have TIMI-3 flow at the end of the case.  While it was possible that a 2 mm  stent could have been put in the proximal diagonal branch the distal branch remained hazy and I was concerned that there would be outflow issues contributing to acute stent thrombosis and therefore elected to treat only with balloon angioplasty.  Patient was pain-free at the end of the case.   Mr. Kimmey had a subtotally occluded small first diagonal branch probably the infarct-related artery.  He had normal LV function with slight high anterolateral hypokinesia.  He does have moderate disease in his proximal codominant RCA as well as moderate least severe segmental disease in his PLA system.  After reviewing with Dr. Is Elyn Peers the consensus is  that this will best be treated in a staged fashion during his hospitalization for complete revascularization.  Angiomax was turned off.  The sheath will be removed.  The patient will go to a cardiac telemetry bed and be scheduled for staged RCA intervention in the next 24 to 48 hours. Quay Burow. MD, [facility] 10/24/2019 8:13 AM    Cardiac Studies   LHC: 10/24/2019   RPAV lesion is 75% stenosed.  Prox RCA lesion is 60% stenosed.  Previously placed Dist LAD stent (unknown type) is widely patent.  Prox LAD to Mid LAD lesion is 60% stenosed.  1st Diag-1 lesion is 99% stenosed.  1st Diag-2 lesion is 99% stenosed.  Post intervention, there is a 25% residual stenosis.  Post intervention, there is a 80% residual stenosis.  There is mild left ventricular systolic dysfunction.  LV end diastolic pressure is normal.  The left ventricular ejection fraction is 50-55% by visual estimate.  Patient Profile     64 y.o. 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  Assessment & Plan    1. CAD, NSTEMI, s/p LHC this am     - 1st Diag-1 lesion is 99% stenosed. - 1st Diag-2  lesion is 99% stenosed. - Post intervention, there is a 25% residual stenosis. - Post intervention, there is a 80% residual stenosis. There is mild left ventricular systolic dysfunction  with slight high anterolateral hypokinesia.   We will obtain echo -We will continue aspirin, Brilinta, lisinopril, metoprolol and high-dose atorvastatin, his vitals are normal -The patient was noncompliant with medical regimen prior to admission he was not taking statin or blood pressure medications  - He does have moderate disease in his proximal codominant RCA as well as moderate least severe segmental disease in his PLA system.  After reviewing with Dr. Is Leeroy Bock the consensus is that this will best be treated in a staged fashion during his hospitalization for complete revascularization. The patient will go to a cardiac telemetry bed and be scheduled for staged RCA intervention in the next 24 to 48 hours.  -We will arrange for tomorrow with Dr. Gwenlyn Found -He is creatinine pre and post procedure is 1.3, he is last creatinine in 2016 was 1.46. -The patient is currently asymptomatic.  2. Hypertension -Well-controlled on lisinopril and metoprolol  3. Hyperlipidemia   - continue high-dose atorvastatin, the patient seems irritated by initiation of statins as he strongly believes that diet and exercise is sufficient, he was not taking statins prior to admission.  He is explained that while exercise and diet are extremely important in treatment of coronary artery disease, however use of antiplatelet therapy, statins and blood pressure control medications are essential to therapy and secondary CAD prevention of people with known coronary artery disease and especially post interventions.  For questions or updates, please contact [place] Please consult [email or web address] for contact info under     Signed, Ena Dawley, MD  10/24/2019, 10:39 AM

## 2019-10-25 ENCOUNTER — Other Ambulatory Visit: Payer: Self-pay | Admitting: Physician Assistant

## 2019-10-25 ENCOUNTER — Encounter (HOSPITAL_COMMUNITY)
Admission: EM | Disposition: A | Discharge status: Home / Self Care | Payer: Self-pay | Source: Home / Self Care | Attending: Cardiovascular Disease

## 2019-10-25 DIAGNOSIS — E785 Hyperlipidemia, unspecified: Secondary | ICD-10-CM

## 2019-10-25 HISTORY — PX: CORONARY STENT INTERVENTION: CATH118234

## 2019-10-25 LAB — CBC
HCT: 46 % (ref 39.0–52.0)
Hemoglobin: 15.6 g/dL (ref 13.0–17.0)
MCH: 31.9 pg (ref 26.0–34.0)
MCHC: 33.9 g/dL (ref 30.0–36.0)
MCV: 94.1 fL (ref 80.0–100.0)
Platelets: 206 10*3/uL (ref 150–400)
RBC: 4.89 MIL/uL (ref 4.22–5.81)
RDW: 13.2 % (ref 11.5–15.5)
WBC: 9.6 10*3/uL (ref 4.0–10.5)
nRBC: 0 % (ref 0.0–0.2)

## 2019-10-25 LAB — BASIC METABOLIC PANEL
Anion gap: 7 (ref 5–15)
BUN: 16 mg/dL (ref 8–23)
CO2: 28 mmol/L (ref 22–32)
Calcium: 8.7 mg/dL — ABNORMAL LOW (ref 8.9–10.3)
Chloride: 105 mmol/L (ref 98–111)
Creatinine, Ser: 1.29 mg/dL — ABNORMAL HIGH (ref 0.61–1.24)
GFR calc Af Amer: >60 mL/min (ref >60)
GFR calc non Af Amer: 59 mL/min — ABNORMAL LOW (ref >60)
Glucose, Bld: 117 mg/dL — ABNORMAL HIGH (ref 70–99)
Potassium: 3.6 mmol/L (ref 3.5–5.1)
Sodium: 140 mmol/L (ref 135–145)

## 2019-10-25 LAB — HEPATIC FUNCTION PANEL
ALT: 32 U/L (ref 0–44)
AST: 65 U/L — ABNORMAL HIGH (ref 15–41)
Albumin: 3.4 g/dL — ABNORMAL LOW (ref 3.5–5.0)
Alkaline Phosphatase: 51 U/L (ref 38–126)
Bilirubin, Direct: 0.1 mg/dL (ref 0.0–0.2)
Indirect Bilirubin: 1.2 mg/dL — ABNORMAL HIGH (ref 0.3–0.9)
Total Bilirubin: 1.3 mg/dL — ABNORMAL HIGH (ref 0.3–1.2)
Total Protein: 5.9 g/dL — ABNORMAL LOW (ref 6.5–8.1)

## 2019-10-25 LAB — POCT ACTIVATED CLOTTING TIME
Activated Clotting Time: 142 seconds
Activated Clotting Time: 307 seconds
Activated Clotting Time: 351 seconds
Activated Clotting Time: 224 seconds

## 2019-10-25 LAB — TROPONIN I (HIGH SENSITIVITY): Troponin I (High Sensitivity): 6197 ng/L (ref <18)

## 2019-10-25 SURGERY — CORONARY STENT INTERVENTION
Anesthesia: LOCAL

## 2019-10-25 MED ORDER — MORPHINE SULFATE (PF) 2 MG/ML IV SOLN
INTRAVENOUS | Status: AC
  Administered 2019-10-25: 2 mg via INTRAVENOUS
  Filled 2019-10-25: qty 1

## 2019-10-25 MED ORDER — FENTANYL CITRATE (PF) 100 MCG/2ML IJ SOLN
INTRAMUSCULAR | Status: AC
  Filled 2019-10-25: qty 2

## 2019-10-25 MED ORDER — LISINOPRIL 20 MG PO TABS: 20.0000 mg | ORAL_TABLET | Freq: Every day | ORAL | 11 refills | Status: DC

## 2019-10-25 MED ORDER — NITROGLYCERIN 1 MG/10 ML FOR IR/CATH LAB
INTRA_ARTERIAL | Status: AC
  Filled 2019-10-25: qty 10

## 2019-10-25 MED ORDER — SODIUM CHLORIDE 0.9 % WEIGHT BASED INFUSION: 1.0000 mL/kg/h | INTRAVENOUS | Status: AC

## 2019-10-25 MED ORDER — MIDAZOLAM HCL 2 MG/2ML IJ SOLN
INTRAMUSCULAR | Status: DC | PRN
  Administered 2019-10-25: 2 mg via INTRAVENOUS

## 2019-10-25 MED ORDER — NITROGLYCERIN 0.4 MG SL SUBL: 0.4000 mg | SUBLINGUAL_TABLET | SUBLINGUAL | 3 refills | Status: DC | PRN

## 2019-10-25 MED ORDER — FENTANYL CITRATE (PF) 100 MCG/2ML IJ SOLN
INTRAMUSCULAR | Status: DC | PRN
  Administered 2019-10-25: 25 ug via INTRAVENOUS

## 2019-10-25 MED ORDER — HEPARIN SODIUM (PORCINE) 1000 UNIT/ML IJ SOLN
INTRAMUSCULAR | Status: DC | PRN
  Administered 2019-10-25: 10000 [IU] via INTRAVENOUS

## 2019-10-25 MED ORDER — ATORVASTATIN CALCIUM 80 MG PO TABS: 80.0000 mg | ORAL_TABLET | Freq: Every day | ORAL | 11 refills | Status: DC

## 2019-10-25 MED ORDER — IOHEXOL 350 MG/ML SOLN
INTRAVENOUS | Status: DC | PRN
  Administered 2019-10-25: 11:00:00 125 mL

## 2019-10-25 MED ORDER — TICAGRELOR 90 MG PO TABS: 90.0000 mg | ORAL_TABLET | Freq: Two times a day (BID) | ORAL | 11 refills | Status: DC

## 2019-10-25 MED ORDER — METOPROLOL TARTRATE 25 MG PO TABS: 25.0000 mg | ORAL_TABLET | Freq: Two times a day (BID) | ORAL | 11 refills | Status: DC

## 2019-10-25 MED ORDER — TICAGRELOR 90 MG PO TABS: 90.0000 mg | ORAL_TABLET | Freq: Two times a day (BID) | ORAL | 0 refills | Status: DC

## 2019-10-25 MED ORDER — ASPIRIN 81 MG PO TBEC: 81.0000 mg | DELAYED_RELEASE_TABLET | Freq: Every day | ORAL | 11 refills | Status: AC

## 2019-10-25 MED ORDER — HEPARIN (PORCINE) IN NACL 1000-0.9 UT/500ML-% IV SOLN
INTRAVENOUS | Status: DC | PRN
  Administered 2019-10-25 (×2): 500 mL

## 2019-10-25 MED ORDER — SODIUM CHLORIDE 0.9% FLUSH: 3.0000 mL | Freq: Two times a day (BID) | INTRAVENOUS | Status: DC

## 2019-10-25 MED ORDER — LIDOCAINE HCL (PF) 1 % IJ SOLN
INTRAMUSCULAR | Status: AC
  Filled 2019-10-25: qty 30

## 2019-10-25 MED ORDER — ASPIRIN 81 MG PO TBEC: 81.0000 mg | DELAYED_RELEASE_TABLET | Freq: Every day | ORAL | 11 refills | Status: DC

## 2019-10-25 MED ORDER — SODIUM CHLORIDE 0.9% FLUSH: 3.0000 mL | INTRAVENOUS | Status: DC | PRN

## 2019-10-25 MED ORDER — HEPARIN SODIUM (PORCINE) 1000 UNIT/ML IJ SOLN
INTRAMUSCULAR | Status: AC
  Filled 2019-10-25: qty 1

## 2019-10-25 MED ORDER — NITROGLYCERIN 1 MG/10 ML FOR IR/CATH LAB
INTRA_ARTERIAL | Status: DC | PRN
  Administered 2019-10-25: 200 ug via INTRACORONARY

## 2019-10-25 MED ORDER — MIDAZOLAM HCL 2 MG/2ML IJ SOLN
INTRAMUSCULAR | Status: AC
  Filled 2019-10-25: qty 2

## 2019-10-25 MED ORDER — HEPARIN (PORCINE) IN NACL 1000-0.9 UT/500ML-% IV SOLN
INTRAVENOUS | Status: AC
  Filled 2019-10-25: qty 1000

## 2019-10-25 MED ORDER — NITROGLYCERIN 0.4 MG SL SUBL: 0.4000 mg | SUBLINGUAL_TABLET | SUBLINGUAL | 3 refills | Status: AC | PRN

## 2019-10-25 MED ORDER — VERAPAMIL HCL 2.5 MG/ML IV SOLN
INTRAVENOUS | Status: AC
  Filled 2019-10-25: qty 2

## 2019-10-25 MED ORDER — VERAPAMIL HCL 2.5 MG/ML IV SOLN
INTRAVENOUS | Status: DC | PRN
  Administered 2019-10-25: 10:00:00 10 mL via INTRA_ARTERIAL

## 2019-10-25 MED ORDER — SODIUM CHLORIDE 0.9 % IV SOLN: 250.0000 mL | INTRAVENOUS | Status: DC | PRN

## 2019-10-25 MED ORDER — LIDOCAINE HCL (PF) 1 % IJ SOLN
INTRAMUSCULAR | Status: DC | PRN
  Administered 2019-10-25: 2 mL

## 2019-10-25 MED FILL — BRILINTA 90 MG TABLET: 90 | 30 days supply | Qty: 60 | Fill #0

## 2019-10-25 MED FILL — Verapamil HCl IV Soln 2.5 MG/ML: INTRAVENOUS | Qty: 2 | Status: AC

## 2019-10-25 SURGICAL SUPPLY — 19 items
BALLN SAPPHIRE 2.0X12 (BALLOONS) ×2
BALLN SAPPHIRE ~~LOC~~ 2.5X15 (BALLOONS) ×1 IMPLANT
BALLN SAPPHIRE ~~LOC~~ 3.25X10 (BALLOONS) ×1 IMPLANT
BALLOON SAPPHIRE 2.0X12 (BALLOONS) IMPLANT
CATH VISTA GUIDE 6FR JR4 (CATHETERS) ×1 IMPLANT
DEVICE RAD COMP TR BAND LRG (VASCULAR PRODUCTS) ×1 IMPLANT
GLIDESHEATH SLEND SS 6F .021 (SHEATH) ×1 IMPLANT
GUIDEWIRE INQWIRE 1.5J.035X260 (WIRE) IMPLANT
INQWIRE 1.5J .035X260CM (WIRE) ×2
KIT ENCORE 26 ADVANTAGE (KITS) ×1 IMPLANT
KIT ESSENTIALS PG (KITS) ×1 IMPLANT
KIT HEART LEFT (KITS) ×2 IMPLANT
PACK CARDIAC CATHETERIZATION (CUSTOM PROCEDURE TRAY) ×2 IMPLANT
STENT RESOLUTE ONYX 2.25X12 (Permanent Stent) ×1 IMPLANT
STENT RESOLUTE ONYX 2.25X15 (Permanent Stent) ×1 IMPLANT
STENT RESOLUTE ONYX 3.0X12 (Permanent Stent) ×1 IMPLANT
TRANSDUCER W/STOPCOCK (MISCELLANEOUS) ×2 IMPLANT
TUBING CIL FLEX 10 FLL-RA (TUBING) ×2 IMPLANT
WIRE ASAHI PROWATER 180CM (WIRE) ×1 IMPLANT

## 2019-10-25 NOTE — TOC Benefit Eligibility Note (Signed)
Transition of Care [facility]) Benefit Eligibility Note    Patient Details  Name: Donald Calhoun MRN: [id number] Date of Birth: [birth year]   Medication/Dose: BRILINTA  90 MF BID  Covered?: Yes  Tier: (NO)  Prescription Coverage Preferred Pharmacy: [facility]  Spoke with Person/Company/Phone Number:: CHAKIA  @ [employer] # [phone]  Co-Pay: $ 95.06  Prior Approval: No  Deductible: Unmet       Mardene Sayer Phone Number: 10/25/2019, 2:57 PM

## 2019-10-25 NOTE — Discharge Summary (Signed)
Discharge Summary    Patient ID: Donald PostDaniel Binegar MRN: [id number]; DOB: 1955-10-29  Admit date: 10/23/2019 Discharge date: 10/25/2019  Primary Care Provider: Shirlean MylarWebb, Carol, MD  Primary Cardiologist: Tobias AlexanderKatarina Nelson, MD  Primary Electrophysiologist:  None   Discharge Diagnoses    Principal Problem:   NSTEMI (non-ST elevated myocardial infarction) [facility](HCC) Active Problems:   HTN (hypertension)   Hyperlipidemia   Non-ST elevation (NSTEMI) myocardial infarction [facility](HCC)   Allergies No Known Allergies  Diagnostic Studies/Procedures    ECHO: 10/24/2019 1. Left ventricular ejection fraction, by visual estimation, is 60 to 65%. The left ventricle has normal function. There is moderately increased left ventricular hypertrophy. 2. Left ventricular diastolic parameters are indeterminate. 3. Global right ventricle has normal systolic function.The right ventricular size is normal. 4. Left atrial size was mildly dilated. 5. Right atrial size was normal. 6. The mitral valve is normal in structure. Trivial mitral valve regurgitation. 7. The tricuspid valve is normal in structure. 8. The aortic valve is tricuspid. Aortic valve regurgitation is not visualized. No evidence of aortic valve sclerosis or stenosis. 9. The pulmonic valve was not well visualized. Pulmonic valve regurgitation is trivial. 10. There is mild dilatation of the aortic root measuring 41 mm. 11. TR signal is inadequate for assessing pulmonary artery systolic pressure. 12. The inferior vena cava is normal in size with greater than 50% respiratory variability, suggesting right atrial pressure of 3 mmHg.  LHC: 10/24/2019   RPAV lesion is 75% stenosed.  Prox RCA lesion is 60% stenosed.  Previously placed Dist LAD stent (unknown type) is widely patent.  Prox LAD to Mid LAD lesion is 60% stenosed.  1st Diag-1 lesion is 99% stenosed.  1st Diag-2 lesion is 99% stenosed.  Post intervention, there is a 25% residual  stenosis.  Post intervention, there is a 80% residual stenosis.  There is mild left ventricular systolic dysfunction.  LV end diastolic pressure is normal.  The left ventricular ejection fraction is 50-55% by visual estimate.  PCI: 10/25/2019  RPAV lesion is 75% stenosed.  A drug-eluting stent was successfully placed using a STENT RESOLUTE ONYX 2.25X15.  A drug-eluting stent was successfully placed using a STENT RESOLUTE ONYX 2.25X12.  Post intervention, there is a 0% residual stenosis.  Prox RCA lesion is 60% stenosed.  Post intervention, there is a 0% residual stenosis.  A drug-eluting stent was successfully placed using a STENT RESOLUTE ONYX 3.0X12.   1. Successful PCI of the PL branch of the RCA with DES x 2 2. Successful PCI of the proximal RCA with DES x 1.   Plan: DAPT for one year. Anticipate same day DC today.  _____________   History of Present Illness     Donald Calhoun is a 64 y.o. male with h/o acute inferolateral MI in august 2014 at which time LHC showed 95-99% dLAD, 80-90% pD1, 40-50% OM4, 30-40% RCA (pt is s/p PCI to LAD w/ DES at that time) presented 01/05 w/ CP and abnormal troponin.  Hospital Course     Consultants: None   He had been off all medications prior to admission.  He was started on aspirin, high-dose statin, and beta-blocker.  He was also started on lisinopril for blood pressure control.  He was taken to the Cath Lab on 1/6.  Results are above.  He had PTCA to diagonal 1 and 2, but still with significant RCA stenosis.  Plan was for hydration and staged intervention.  His creatinine remained stable post procedure.  Patient was reluctant to stay  in the hospital 48 hours for continued hydration, so the staged intervention was performed on 10/25/2019.  Results are above.  He had three drug-eluting stents placed to his RCA, two to the PL branch, and one to the proximal RCA.  He tolerated the procedure well.  Dr. [place] cleared him for same-day  post PCI discharge.  He was seen by cardiac rehab and encouraged to follow-up with outpatient cardiac rehab.  He was given Brilinta for 30 days for free.  It is not clear how much the Brilinta will cost him as an outpatient, he may have to be changed to Plavix.  Compliance with discharge medications will be encouraged and he has a follow-up appointment arranged.  He was ambulating without chest pain or shortness of breath post procedure and is considered stable for discharge, to follow-up as an outpatient.   Did the patient have an acute coronary syndrome (MI, NSTEMI, STEMI, etc) this admission?:  Yes                               AHA/ACC Clinical Performance & Quality Measures: 1. Aspirin prescribed? - Yes 2. ADP Receptor Inhibitor (Plavix/Clopidogrel, Brilinta/Ticagrelor or Effient/Prasugrel) prescribed (includes medically managed patients)? - Yes 3. Beta Blocker prescribed? - Yes 4. High Intensity Statin (Lipitor 40-80mg  or Crestor 20-40mg ) prescribed? - Yes 5. EF assessed during THIS hospitalization? - Yes 6. For EF <40%, was ACEI/ARB prescribed? - Not Applicable (EF >/= 40%) 7. For EF <40%, Aldosterone Antagonist (Spironolactone or Eplerenone) prescribed? - Not Applicable (EF >/= 40%) 8. Cardiac Rehab Phase II ordered (Included Medically managed Patients)? - Yes   _____________  Discharge Vitals Blood pressure (!) 159/82, pulse (!) 55, temperature 98 F (36.7 C), temperature source Oral, resp. rate 16, height 6\' 2"  (1.88 m), weight 108 kg, SpO2 98 %.  Filed Weights   10/24/19 1746 10/25/19 0605  Weight: 111.6 kg 108 kg    Labs & Radiologic Studies    CBC Recent Labs    10/24/19 0509 10/25/19 0343  WBC 8.4 9.6  HGB 15.3 15.6  HCT 45.2 46.0  MCV 94.6 94.1  PLT 214 206   Basic Metabolic Panel Recent Labs    12/23/19 0509 10/25/19 0343  NA 141 140  K 3.5 3.6  CL 104 105  CO2 26 28  GLUCOSE 118* 117*  BUN 20 16  CREATININE 1.32* 1.29*  CALCIUM 8.7* 8.7*    Liver Function Tests Recent Labs    10/25/19 0343  AST 65*  ALT 32  ALKPHOS 51  BILITOT 1.3*  PROT 5.9*  ALBUMIN 3.4*   No results for input(s): LIPASE, AMYLASE in the last 72 hours. High Sensitivity Troponin:   Recent Labs  Lab 10/23/19 2302 10/24/19 0008 10/24/19 0509 10/24/19 1440 10/25/19 0635  TROPONINIHS 534* 939* 11,183* 17,601* 6,197*    BNP Invalid input(s): POCBNP D-Dimer No results for input(s): DDIMER in the last 72 hours. Hemoglobin A1C Recent Labs    10/24/19 0008  HGBA1C 5.4   Fasting Lipid Panel Recent Labs    10/24/19 0509  CHOL 176  HDL 49  LDLCALC 100*  TRIG 134  CHOLHDL 3.6   Thyroid Function Tests Recent Labs    10/24/19 0008  TSH 1.255   _____________  DG Chest 2 View  Result Date: 10/23/2019 CLINICAL DATA:  Chest pain EXAM: CHEST - 2 VIEW COMPARISON:  2016 FINDINGS: The heart size and mediastinal contours are within normal limits.  Both lungs are clear. No pleural effusion. The visualized skeletal structures are unremarkable. IMPRESSION: No acute process in the chest. Electronically Signed   By: Guadlupe SpanishPraneil  Patel M.D.   On: 10/23/2019 21:24   CARDIAC CATHETERIZATION  Result Date: 10/25/2019  RPAV lesion is 75% stenosed.  A drug-eluting stent was successfully placed using a STENT RESOLUTE ONYX 2.25X15.  A drug-eluting stent was successfully placed using a STENT RESOLUTE ONYX 2.25X12.  Post intervention, there is a 0% residual stenosis.  Prox RCA lesion is 60% stenosed.  Post intervention, there is a 0% residual stenosis.  A drug-eluting stent was successfully placed using a STENT RESOLUTE ONYX 3.0X12.  1. Successful PCI of the PL branch of the RCA with DES x 2 2. Successful PCI of the proximal RCA with DES x 1. Plan: DAPT for one year. Anticipate same day DC today.   CARDIAC CATHETERIZATION  Result Date: 10/24/2019  RPAV lesion is 75% stenosed.  Prox RCA lesion is 60% stenosed.  Previously placed Dist LAD stent (unknown type) is  widely patent.  Prox LAD to Mid LAD lesion is 60% stenosed.  1st Diag-1 lesion is 99% stenosed.  1st Diag-2 lesion is 99% stenosed.  Post intervention, there is a 25% residual stenosis.  Post intervention, there is a 80% residual stenosis.  There is mild left ventricular systolic dysfunction.  LV end diastolic pressure is normal.  The left ventricular ejection fraction is 50-55% by visual estimate.  Donald PostDaniel Vari is a 64 y.o. male  [id number] LOCATION:  FACILITY: [facility] PHYSICIAN: Nanetta BattyJonathan Berry, M.D. 26-Mar-1956 DATE OF PROCEDURE:  10/24/2019 DATE OF DISCHARGE: CARDIAC CATHETERIZATION / PCI Diag 1 History obtained from chart review.Donald PostDaniel Petrosky is a 64 y.o. 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  He did have an episode of slow nonsustained VT versus AI VR at 430 this morning.  Troponins rose to 11,000. PROCEDURE DESCRIPTION: The patient was brought to the second floor [facility] cath lab in the postabsorptive state. He was not premedicated . His right groin was prepped and shaved in usual sterile fashion. Xylocaine 1% was used for local anesthesia. A 6 French sheath was inserted into the right common femoral  artery using standard Seldinger technique.  5 French right left Judkins diagnostic catheters (Patel catheter used for selective coronary angiography left ventriculography respectively.  Isovue dye was used for the entirety of the case.  Retrograde aorta, ventricular and pullback pressures were recorded. Infarct-related artery was a small first diagonal branch.  Patient did have moderate disease in his proximal LAD, proximal codominant right and PLA system.  He received Angiomax bolus followed by infusion with a therapeutic ACT.  He received Brilinta 180 mg p.o. loading dose.  Using an XB LAD 4.0 cm guide catheter  along with a 0.14 comet DFR wire across the proximal LAD revealing a DFR morning of 0.95 suggesting that the proximal LAD lesion was not physiologically significant. I then focused my attention on the infarct-related artery, the first diagonal branch.  I crossed the proximal subtotally occluded diagonal branch with a 0.14 Prowater wire and performed POBA with a 1.5 mm x 12 mm long balloon of the proximal and distal lesion.  I did have TIMI-3 flow at the end of the case.  While it was possible that  a 2 mm stent could have been put in the proximal diagonal branch the distal branch remained hazy and I was concerned that there would be outflow issues contributing to acute stent thrombosis and therefore elected to treat only with balloon angioplasty.  Patient was pain-free at the end of the case.   Mr. Beske had a subtotally occluded small first diagonal branch probably the infarct-related artery.  He had normal LV function with slight high anterolateral hypokinesia.  He does have moderate disease in his proximal codominant RCA as well as moderate least severe segmental disease in his PLA system.  After reviewing with Dr. Is Leeroy Bock the consensus is that this will best be treated in a staged fashion during his hospitalization for complete revascularization.  Angiomax was turned off.  The sheath will be removed.  The patient will go to a cardiac telemetry bed and be scheduled for staged RCA intervention in the next 24 to 48 hours. Quay Burow. MD, [facility] 10/24/2019 8:13 AM   ECHOCARDIOGRAM COMPLETE  Result Date: 10/24/2019   ECHOCARDIOGRAM REPORT   Patient Name:   VASILI FOK Date of Exam: 10/24/2019 Medical Rec #:  [id number]     Height:       74.0 in Accession #:    [id number]    Weight:       246.0 lb Date of Birth:  1955-10-23     BSA:          2.37 m Patient Age:    64 years      BP:           121/65 mmHg Patient Gender: M             HR:           57 bpm. Exam Location:  Inpatient Procedure: 2D Echo,  Cardiac Doppler and Color Doppler Indications:    Acute Myocardial Infarction 410.  History:        Patient has no prior history of Echocardiogram examinations.                 Previous Myocardial Infarction and CAD; Risk                 Factors:Hypertension and Dyslipidemia.  Sonographer:    Jonelle Sidle Dance Referring Phys: [id number] STEPHANIE FALK [place]  1. Left ventricular ejection fraction, by visual estimation, is 60 to 65%. The left ventricle has normal function. There is moderately increased left ventricular hypertrophy.  2. Left ventricular diastolic parameters are indeterminate.  3. Global right ventricle has normal systolic function.The right ventricular size is normal.  4. Left atrial size was mildly dilated.  5. Right atrial size was normal.  6. The mitral valve is normal in structure. Trivial mitral valve regurgitation.  7. The tricuspid valve is normal in structure.  8. The aortic valve is tricuspid. Aortic valve regurgitation is not visualized. No evidence of aortic valve sclerosis or stenosis.  9. The pulmonic valve was not well visualized. Pulmonic valve regurgitation is trivial. 10. There is mild dilatation of the aortic root measuring 41 mm. 11. TR signal is inadequate for assessing pulmonary artery systolic pressure. 12. The inferior vena cava is normal in size with greater than 50% respiratory variability, suggesting right atrial pressure of 3 mmHg. FINDINGS  Left Ventricle: Left ventricular ejection fraction, by visual estimation, is 60 to 65%. The left ventricle has normal function. The left ventricle has no regional wall motion abnormalities. The left ventricular internal cavity size was  the left ventricle is normal in size. There is moderately increased left ventricular hypertrophy. Left ventricular diastolic parameters are indeterminate. Right Ventricle: The right ventricular size is normal. No increase in right ventricular wall thickness. Global RV systolic function is has normal  systolic function. Left Atrium: Left atrial size was mildly dilated. Right Atrium: Right atrial size was normal in size Pericardium: There is no evidence of pericardial effusion. Mitral Valve: The mitral valve is normal in structure. Trivial mitral valve regurgitation. Tricuspid Valve: The tricuspid valve is normal in structure. Tricuspid valve regurgitation is not demonstrated. Aortic Valve: The aortic valve is tricuspid. Aortic valve regurgitation is not visualized. The aortic valve is structurally normal, with no evidence of sclerosis or stenosis. Pulmonic Valve: The pulmonic valve was not well visualized. Pulmonic valve regurgitation is trivial. Pulmonic regurgitation is trivial. Aorta: Aortic dilatation noted. There is mild dilatation of the aortic root measuring 41 mm. Venous: The inferior vena cava is normal in size with greater than 50% respiratory variability, suggesting right atrial pressure of 3 mmHg. IAS/Shunts: The interatrial septum was not well visualized.  LEFT VENTRICLE PLAX 2D LVIDd:         4.85 cm  Diastology LVIDs:         2.84 cm  LV e' lateral:   6.20 cm/s LV PW:         1.31 cm  LV E/e' lateral: 13.2 LV IVS:        1.31 cm  LV e' medial:    7.18 cm/s LVOT diam:     2.30 cm  LV E/e' medial:  11.4 LV SV:         80 ml LV SV Index:   32.58 LVOT Area:     4.15 cm  RIGHT VENTRICLE             IVC RV Basal diam:  2.79 cm     IVC diam: 1.62 cm RV S prime:     18.10 cm/s TAPSE (M-mode): 2.5 cm LEFT ATRIUM             Index       RIGHT ATRIUM           Index LA diam:        4.80 cm 2.02 cm/m  RA Area:     17.10 cm LA Vol (A2C):   98.4 ml 41.46 ml/m RA Volume:   46.70 ml  19.68 ml/m LA Vol (A4C):   79.4 ml 33.45 ml/m LA Biplane Vol: 89.2 ml 37.58 ml/m  AORTIC VALVE LVOT Vmax:   128.00 cm/s LVOT Vmean:  81.100 cm/s LVOT VTI:    0.245 m  AORTA Ao Root diam: 4.10 cm Ao Asc diam:  3.30 cm MITRAL VALVE MV Area (PHT): 3.65 cm             SHUNTS MV PHT:        60.32 msec           Systemic VTI:  0.24  m MV Decel Time: 208 msec             Systemic Diam: 2.30 cm MV E velocity: 82.10 cm/s 103 cm/s MV A velocity: 79.40 cm/s 70.3 cm/s MV E/A ratio:  1.03       1.5  Epifanio Lesches MD Electronically signed by Epifanio Lesches MD Signature Date/Time: 10/24/2019/6:27:39 PM    Final    Disposition   Pt is being discharged home today in good condition.  Follow-up Plans & Appointments  Follow-up Information    Lewayne Bunting, MD Follow up on 11/13/2019.   Specialty: Cardiology Why: Please arrive at 7:45 am for an 8:00 am appointment. Contact information: [address] [place] [address] [phone]          Discharge Instructions    AMB Referral to Cardiac Rehabilitation - Phase II   Complete by: As directed    Diagnosis: NSTEMI   After initial evaluation and assessments completed: Virtual Based Care may be provided alone or in conjunction with Phase 2 Cardiac Rehab based on patient barriers.: Yes   Call MD for:  redness, tenderness, or signs of infection (pain, swelling, redness, odor or green/yellow discharge around incision site)   Complete by: As directed    Diet - low sodium heart healthy   Complete by: As directed    Increase activity slowly   Complete by: As directed       Discharge Medications   Allergies as of 10/25/2019   No Known Allergies     Medication List    TAKE these medications   ascorbic acid 500 MG tablet Commonly known as: VITAMIN C Take 500 mg by mouth daily.   aspirin 81 MG EC tablet Take 1 tablet (81 mg total) by mouth daily. Start taking on: October 26, 2019   atorvastatin 80 MG tablet Commonly known as: LIPITOR Take 1 tablet (80 mg total) by mouth daily at 6 PM.   cholecalciferol 25 MCG (1000 UT) tablet Commonly known as: VITAMIN D3 Take 1,000 Units by mouth daily.   cyclobenzaprine 10 MG tablet Commonly known as: FLEXERIL Take 1 tablet by mouth 3 (three) times daily as needed for muscle spasms.   Fish Oil  1000 MG Caps Take 1,000 mg by mouth daily.   HYDROcodone-acetaminophen 5-325 MG tablet Commonly known as: NORCO/VICODIN Take 1 tablet by mouth every 6 (six) hours as needed for moderate pain.   lisinopril 20 MG tablet Commonly known as: ZESTRIL Take 1 tablet (20 mg total) by mouth daily. Start taking on: October 26, 2019   magnesium oxide 400 MG tablet Commonly known as: MAG-OX Take 400 mg by mouth daily.   metoprolol tartrate 25 MG tablet Commonly known as: LOPRESSOR Take 1 tablet (25 mg total) by mouth 2 (two) times daily.   multivitamin tablet Take 1 tablet by mouth daily.   nitroGLYCERIN 0.4 MG SL tablet Commonly known as: NITROSTAT Place 1 tablet (0.4 mg total) under the tongue every 5 (five) minutes x 3 doses as needed for chest pain.   SYSTANE OP Place 1 drop into both eyes daily as needed (dry eyes).   testosterone cypionate 200 MG/ML injection Commonly known as: DEPOTESTOSTERONE CYPIONATE Inject 150 mg into the muscle once a week.   ticagrelor 90 MG Tabs tablet Commonly known as: BRILINTA Take 1 tablet (90 mg total) by mouth 2 (two) times daily.   Turmeric 500 MG Caps Take 500 mg by mouth daily.   vitamin E 1000 UNIT capsule Take 1,000 Units by mouth daily.   Vyvanse 50 MG capsule Generic drug: lisdexamfetamine Take 50 mg by mouth daily as needed (ADD).   zinc gluconate 50 MG tablet Take 50 mg by mouth daily.          Outstanding Labs/Studies   None  Duration of Discharge Encounter   Greater than 30 minutes including physician time.  Signed, Theodore Demark, PA-C 10/25/2019, 4:16 PM

## 2019-10-25 NOTE — Progress Notes (Addendum)
Progress Note  Patient Name: Tivon Lemoine Date of Encounter: 10/25/2019  Primary Cardiologist: Dr Allyson Sabal  Subjective   No CP or SOB overnight.  Inpatient Medications    Scheduled Meds: . aspirin EC  81 mg Oral Daily  . atorvastatin  80 mg Oral q1800  . lisinopril  20 mg Oral Daily  . metoprolol tartrate  25 mg Oral BID  . multivitamin with minerals  1 tablet Oral Daily  . polyvinyl alcohol   Both Eyes Daily  . sodium chloride flush  3 mL Intravenous Q12H  . sodium chloride flush  3 mL Intravenous Q12H  . sodium chloride flush  3 mL Intravenous Q12H  . tamsulosin  0.4 mg Oral Daily  . ticagrelor  90 mg Oral BID  . vitamin E  1,000 Units Oral Daily   Continuous Infusions: . sodium chloride    . sodium chloride 1 mL/kg/hr (10/25/19 0500)  . nitroGLYCERIN 5 mcg/min (10/24/19 1648)   PRN Meds: sodium chloride, acetaminophen, cyclobenzaprine, morphine injection, nitroGLYCERIN, ondansetron (ZOFRAN) IV, sodium chloride flush, sodium chloride flush   Vital Signs    Vitals:   10/24/19 2100 10/25/19 0400 10/25/19 0605 10/25/19 0805  BP: 138/70  (!) 149/92 (!) 165/100  Pulse: 61  64 63  Resp: 18  20   Temp: 99.5 F (37.5 C)  98 F (36.7 C)   TempSrc: Oral  Oral   SpO2: 97%  96%   Weight:   108 kg   Height:  6\' 2"  (1.88 m)      Intake/Output Summary (Last 24 hours) at 10/25/2019 0856 Last data filed at 10/25/2019 0725 Gross per 24 hour  Intake 446.4 ml  Output 1100 ml  Net -653.6 ml   Last 3 Weights 10/25/2019 10/24/2019 10/21/2016  Weight (lbs) 238 lb 1.6 oz 246 lb 0.5 oz 246 lb  Weight (kg) 108 kg 111.6 kg 111.585 kg      Telemetry    SR, S brady to mid-50s overnight- Personally Reviewed  ECG    01/07 ECG is SR, HR 64, inf T waves and septal Q waves no sig change from 01/06 - Personally Reviewed  Physical Exam   General: Well developed, well nourished, male in no acute distress Head: Eyes PERRLA, Head normocephalic and atraumatic Lungs: clear bilaterally to  auscultation. Heart: HRRR S1 S2, without rub or gallop. No murmur. 4/4 extremity pulses are 2+ & equal. No JVD. Abdomen: Bowel sounds are present, abdomen soft and non-tender without masses or  hernias noted. Msk: Normal strength and tone for age. Extremities: No clubbing, cyanosis or edema.    Skin:  No rashes or lesions noted. Neuro: Alert and oriented X 3. Psych:  Good affect, responds appropriately  Labs    High Sensitivity Troponin:   Recent Labs  Lab 10/23/19 2302 10/24/19 0008 10/24/19 0509 10/24/19 1440 10/25/19 0635  TROPONINIHS 534* 939* 11,183* 17,601* 6,197*      Chemistry Recent Labs  Lab 10/23/19 2121 10/24/19 0509 10/25/19 0343  NA 141 141 140  K 3.8 3.5 3.6  CL 101 104 105  CO2 25 26 28   GLUCOSE 122* 118* 117*  BUN 19 20 16   CREATININE 1.33* 1.32* 1.29*  CALCIUM 8.7* 8.7* 8.7*  PROT  --   --  5.9*  ALBUMIN  --   --  3.4*  AST  --   --  65*  ALT  --   --  32  ALKPHOS  --   --  51  BILITOT  --   --  1.3*  GFRNONAA 57* 58* 59*  GFRAA >60 >60 >60  ANIONGAP 15 11 7      Hematology Recent Labs  Lab 10/23/19 2121 10/24/19 0509 10/25/19 0343  WBC 6.0 8.4 9.6  RBC 5.34 4.78 4.89  HGB 16.9 15.3 15.6  HCT 50.4 45.2 46.0  MCV 94.4 94.6 94.1  MCH 31.6 32.0 31.9  MCHC 33.5 33.8 33.9  RDW 12.8 12.8 13.2  PLT 231 214 206   Lab Results  Component Value Date   CHOL 176 10/24/2019   HDL 49 10/24/2019   LDLCALC 100 (H) 10/24/2019   TRIG 134 10/24/2019   CHOLHDL 3.6 10/24/2019   Lab Results  Component Value Date   HGBA1C 5.4 10/24/2019   Lab Results  Component Value Date   TSH 1.255 10/24/2019    Radiology    DG Chest 2 View  Result Date: 10/23/2019 CLINICAL DATA:  Chest pain EXAM: CHEST - 2 VIEW COMPARISON:  2016 FINDINGS: The heart size and mediastinal contours are within normal limits. Both lungs are clear. No pleural effusion. The visualized skeletal structures are unremarkable. IMPRESSION: No acute process in the chest. Electronically  Signed   By: Guadlupe SpanishPraneil  Patel M.D.   On: 10/23/2019 21:24   CARDIAC CATHETERIZATION  Result Date: 10/24/2019  RPAV lesion is 75% stenosed.  Prox RCA lesion is 60% stenosed.  Previously placed Dist LAD stent (unknown type) is widely patent.  Prox LAD to Mid LAD lesion is 60% stenosed.  1st Diag-1 lesion is 99% stenosed.  1st Diag-2 lesion is 99% stenosed.  Post intervention, there is a 25% residual stenosis.  Post intervention, there is a 80% residual stenosis.  There is mild left ventricular systolic dysfunction.  LV end diastolic pressure is normal.  The left ventricular ejection fraction is 50-55% by visual estimate.  Delma PostDaniel Folts is a 64 y.o. male  [id number] LOCATION:  FACILITY: [facility] PHYSICIAN: Nanetta BattyJonathan Berry, M.D. 04-Apr-1956 DATE OF PROCEDURE:  10/24/2019 DATE OF DISCHARGE: CARDIAC CATHETERIZATION / PCI Diag 1 History obtained from chart review.Delma PostDaniel Pitera is a 64 y.o. 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  He did have an episode of slow nonsustained VT versus AI VR at 430 this morning.  Troponins rose to 11,000. PROCEDURE DESCRIPTION: The patient was brought to the second floor [place] [facility] cath lab in the postabsorptive state. He was not premedicated . His right groin was prepped and shaved in usual sterile fashion. Xylocaine 1% was used for local anesthesia. A 6 French sheath was inserted into the right common femoral  artery using standard Seldinger technique.  5 French right left Judkins diagnostic catheters (Patel catheter used for selective coronary angiography left ventriculography respectively.  Isovue dye was used for the entirety of the case.  Retrograde aorta, ventricular and pullback pressures were recorded. Infarct-related artery was a small first diagonal branch.  Patient did have moderate  disease in his proximal LAD, proximal codominant right and PLA system.  He received Angiomax bolus followed by infusion with a therapeutic ACT.  He received Brilinta 180 mg p.o. loading dose.  Using an XB LAD 4.0 cm guide catheter along with a 0.14 comet DFR wire across the proximal LAD revealing a DFR morning of 0.95 suggesting that the proximal LAD lesion was not physiologically significant. I then  focused my attention on the infarct-related artery, the first diagonal branch.  I crossed the proximal subtotally occluded diagonal branch with a 0.14 Prowater wire and performed POBA with a 1.5 mm x 12 mm long balloon of the proximal and distal lesion.  I did have TIMI-3 flow at the end of the case.  While it was possible that a 2 mm stent could have been put in the proximal diagonal branch the distal branch remained hazy and I was concerned that there would be outflow issues contributing to acute stent thrombosis and therefore elected to treat only with balloon angioplasty.  Patient was pain-free at the end of the case.   Mr. Mittelstaedt had a subtotally occluded small first diagonal branch probably the infarct-related artery.  He had normal LV function with slight high anterolateral hypokinesia.  He does have moderate disease in his proximal codominant RCA as well as moderate least severe segmental disease in his PLA system.  After reviewing with Dr. Is Leeroy Bock the consensus is that this will best be treated in a staged fashion during his hospitalization for complete revascularization.  Angiomax was turned off.  The sheath will be removed.  The patient will go to a cardiac telemetry bed and be scheduled for staged RCA intervention in the next 24 to 48 hours. Quay Burow. MD, [facility] 10/24/2019 8:13 AM   ECHOCARDIOGRAM COMPLETE  Result Date: 10/24/2019   ECHOCARDIOGRAM REPORT   Patient Name:   ROSENDO COUSER Date of Exam: 10/24/2019 Medical Rec #:  [id number]     Height:       74.0 in Accession #:    [id number]     Weight:       246.0 lb Date of Birth:  1956-01-27     BSA:          2.37 m Patient Age:    89 years      BP:           121/65 mmHg Patient Gender: M             HR:           57 bpm. Exam Location:  Inpatient Procedure: 2D Echo, Cardiac Doppler and Color Doppler Indications:    Acute Myocardial Infarction 410.  History:        Patient has no prior history of Echocardiogram examinations.                 Previous Myocardial Infarction and CAD; Risk                 Factors:Hypertension and Dyslipidemia.  Sonographer:    Jonelle Sidle Dance Referring Phys: [id number] STEPHANIE FALK [place]  1. Left ventricular ejection fraction, by visual estimation, is 60 to 65%. The left ventricle has normal function. There is moderately increased left ventricular hypertrophy.  2. Left ventricular diastolic parameters are indeterminate.  3. Global right ventricle has normal systolic function.The right ventricular size is normal.  4. Left atrial size was mildly dilated.  5. Right atrial size was normal.  6. The mitral valve is normal in structure. Trivial mitral valve regurgitation.  7. The tricuspid valve is normal in structure.  8. The aortic valve is tricuspid. Aortic valve regurgitation is not visualized. No evidence of aortic valve sclerosis or stenosis.  9. The pulmonic valve was not well visualized. Pulmonic valve regurgitation is trivial. 10. There is mild dilatation of the aortic root measuring 41 mm. 11. TR signal is inadequate for assessing pulmonary artery  systolic pressure. 12. The inferior vena cava is normal in size with greater than 50% respiratory variability, suggesting right atrial pressure of 3 mmHg. FINDINGS  Left Ventricle: Left ventricular ejection fraction, by visual estimation, is 60 to 65%. The left ventricle has normal function. The left ventricle has no regional wall motion abnormalities. The left ventricular internal cavity size was the left ventricle is normal in size. There is moderately increased left  ventricular hypertrophy. Left ventricular diastolic parameters are indeterminate. Right Ventricle: The right ventricular size is normal. No increase in right ventricular wall thickness. Global RV systolic function is has normal systolic function. Left Atrium: Left atrial size was mildly dilated. Right Atrium: Right atrial size was normal in size Pericardium: There is no evidence of pericardial effusion. Mitral Valve: The mitral valve is normal in structure. Trivial mitral valve regurgitation. Tricuspid Valve: The tricuspid valve is normal in structure. Tricuspid valve regurgitation is not demonstrated. Aortic Valve: The aortic valve is tricuspid. Aortic valve regurgitation is not visualized. The aortic valve is structurally normal, with no evidence of sclerosis or stenosis. Pulmonic Valve: The pulmonic valve was not well visualized. Pulmonic valve regurgitation is trivial. Pulmonic regurgitation is trivial. Aorta: Aortic dilatation noted. There is mild dilatation of the aortic root measuring 41 mm. Venous: The inferior vena cava is normal in size with greater than 50% respiratory variability, suggesting right atrial pressure of 3 mmHg. IAS/Shunts: The interatrial septum was not well visualized.  LEFT VENTRICLE PLAX 2D LVIDd:         4.85 cm  Diastology LVIDs:         2.84 cm  LV e' lateral:   6.20 cm/s LV PW:         1.31 cm  LV E/e' lateral: 13.2 LV IVS:        1.31 cm  LV e' medial:    7.18 cm/s LVOT diam:     2.30 cm  LV E/e' medial:  11.4 LV SV:         80 ml LV SV Index:   32.58 LVOT Area:     4.15 cm  RIGHT VENTRICLE             IVC RV Basal diam:  2.79 cm     IVC diam: 1.62 cm RV S prime:     18.10 cm/s TAPSE (M-mode): 2.5 cm LEFT ATRIUM             Index       RIGHT ATRIUM           Index LA diam:        4.80 cm 2.02 cm/m  RA Area:     17.10 cm LA Vol (A2C):   98.4 ml 41.46 ml/m RA Volume:   46.70 ml  19.68 ml/m LA Vol (A4C):   79.4 ml 33.45 ml/m LA Biplane Vol: 89.2 ml 37.58 ml/m  AORTIC VALVE LVOT  Vmax:   128.00 cm/s LVOT Vmean:  81.100 cm/s LVOT VTI:    0.245 m  AORTA Ao Root diam: 4.10 cm Ao Asc diam:  3.30 cm MITRAL VALVE MV Area (PHT): 3.65 cm             SHUNTS MV PHT:        60.32 msec           Systemic VTI:  0.24 m MV Decel Time: 208 msec             Systemic Diam: 2.30 cm MV E velocity:   82.10 cm/s 103 cm/s MV A velocity: 79.40 cm/s 70.3 cm/s MV E/A ratio:  1.03       1.5  Epifanio Lesches MD Electronically signed by Epifanio Lesches MD Signature Date/Time: 10/24/2019/6:27:39 PM    Final     Cardiac Studies   ECHO: 10/24/2019  1. Left ventricular ejection fraction, by visual estimation, is 60 to 65%. The left ventricle has normal function. There is moderately increased left ventricular hypertrophy.  2. Left ventricular diastolic parameters are indeterminate.  3. Global right ventricle has normal systolic function.The right ventricular size is normal.  4. Left atrial size was mildly dilated.  5. Right atrial size was normal.  6. The mitral valve is normal in structure. Trivial mitral valve regurgitation.  7. The tricuspid valve is normal in structure.  8. The aortic valve is tricuspid. Aortic valve regurgitation is not visualized. No evidence of aortic valve sclerosis or stenosis.  9. The pulmonic valve was not well visualized. Pulmonic valve regurgitation is trivial. 10. There is mild dilatation of the aortic root measuring 41 mm. 11. TR signal is inadequate for assessing pulmonary artery systolic pressure. 12. The inferior vena cava is normal in size with greater than 50% respiratory variability, suggesting right atrial pressure of 3 mmHg.  LHC: 10/24/2019   RPAV lesion is 75% stenosed.  Prox RCA lesion is 60% stenosed.  Previously placed Dist LAD stent (unknown type) is widely patent.  Prox LAD to Mid LAD lesion is 60% stenosed.  1st Diag-1 lesion is 99% stenosed.  1st Diag-2 lesion is 99% stenosed.  Post intervention, there is a 25% residual stenosis.  Post  intervention, there is a 80% residual stenosis.  There is mild left ventricular systolic dysfunction.  LV end diastolic pressure is normal.  The left ventricular ejection fraction is 50-55% by visual estimate.  Patient Profile     64 y.o. 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  Assessment & Plan    1. CAD, NSTEMI, s/p LHC 01/06 - D1 & D2 subtotal>> s/p PTCA w/ 25% and 80% residual stenosis respectively - for staged PCI RCA today - s/p echo w/ nl EF, no WMA mentioned - on ASA, Brilinta, lisinopril and high-dose statin - encourage compliance as outpt, make sure he can afford Brilinta - Cr stable post-cath, continue hydration  2. Hypertension - BP up this am, 140s-150s, but just got meds, follow  3. Hyperlipidemia, LDL goal is < 70 - LDL 100 on profile above - was not on statin pta, believes diet/exercise are enough - encourage compliance  For questions or updates, please contact [facility] Please consult [email or web address] for contact info under     Signed, Theodore Demark, PA-C  10/25/2019, 8:56 AM    The patient was seen, examined and discussed with Theodore Demark, PA-C and I agree with the above.   The patient was asymptomatic overnight, he is ready for his stage procedure for intervention in RCA.  He agrees to that, it will be done by Dr. [place] will talk to him.  He is bradycardic, blood pressure is elevated this morning, he was started on lisinopril 20 mg daily.  His creatinine is stable and normal at 1.29, and GFR of 60.  Troponin severely elevated at 17,600 but then went down to 6200.  He can possibly be discharged later today.  Tobias Alexander, MD 10/25/2019

## 2019-10-25 NOTE — Discharge Instructions (Signed)
PLEASE REMEMBER TO BRING ALL OF YOUR MEDICATIONS TO EACH OF YOUR FOLLOW-UP OFFICE VISITS. ° °PLEASE ATTEND ALL SCHEDULED FOLLOW-UP APPOINTMENTS.  ° °Activity: Increase activity slowly as tolerated. You may shower, but no soaking baths (or swimming) for 1 week. No driving for 1 week. No lifting over 5 lbs for 2 weeks. No sexual activity for 1 week.  ° °You May Return to Work: in 3 weeks (if applicable) ° °Wound Care: You may wash cath site gently with soap and water. Keep cath site clean and dry. If you notice pain, swelling, bleeding or pus at your cath site, please call [phone]. ° ° ° °Cardiac Cath Site Care °Refer to this sheet in the next few weeks. These instructions provide you with information on caring for yourself after your procedure. Your caregiver may also give you more specific instructions. Your treatment has been planned according to current medical practices, but problems sometimes occur. Call your caregiver if you have any problems or questions after your procedure. °HOME CARE INSTRUCTIONS °· You may shower 24 hours after the procedure. Remove the bandage (dressing) and gently wash the site with plain soap and water. Gently pat the site dry.  °· Do not apply powder or lotion to the site.  °· Do not sit in a bathtub, swimming pool, or whirlpool for 5 to 7 days.  °· No bending, squatting, or lifting anything over 10 pounds (4.5 kg) as directed by your caregiver.  °· Inspect the site at least twice daily.  °· Do not drive home if you are discharged the same day of the procedure. Have someone else drive you.  °· You may drive 24 hours after the procedure unless otherwise instructed by your caregiver.  °What to expect: °· Any bruising will usually fade within 1 to 2 weeks.  °· Blood that collects in the tissue (hematoma) may be painful to the touch. It should usually decrease in size and tenderness within 1 to 2 weeks.  °SEEK IMMEDIATE MEDICAL CARE IF: °· You have unusual pain at the site or down the  affected limb.  °· You have redness, warmth, swelling, or pain at the site.  °· You have drainage (other than a small amount of blood on the dressing).  °· You have chills.  °· You have a fever or persistent symptoms for more than 72 hours.  °· You have a fever and your symptoms suddenly get worse.  °· Your leg becomes pale, cool, tingly, or numb.  °· You have heavy bleeding from the site. Hold pressure on the site.  °Document Released: 11/06/2010 Document Revised: 09/23/2011 Document Reviewed:  ° °

## 2019-10-25 NOTE — Plan of Care (Signed)

## 2019-10-25 NOTE — H&P (View-Only) (Signed)
Progress Note  Patient Name: Tivon Lemoine Date of Encounter: 10/25/2019  Primary Cardiologist: Dr Allyson Sabal  Subjective   No CP or SOB overnight.  Inpatient Medications    Scheduled Meds: . aspirin EC  81 mg Oral Daily  . atorvastatin  80 mg Oral q1800  . lisinopril  20 mg Oral Daily  . metoprolol tartrate  25 mg Oral BID  . multivitamin with minerals  1 tablet Oral Daily  . polyvinyl alcohol   Both Eyes Daily  . sodium chloride flush  3 mL Intravenous Q12H  . sodium chloride flush  3 mL Intravenous Q12H  . sodium chloride flush  3 mL Intravenous Q12H  . tamsulosin  0.4 mg Oral Daily  . ticagrelor  90 mg Oral BID  . vitamin E  1,000 Units Oral Daily   Continuous Infusions: . sodium chloride    . sodium chloride 1 mL/kg/hr (10/25/19 0500)  . nitroGLYCERIN 5 mcg/min (10/24/19 1648)   PRN Meds: sodium chloride, acetaminophen, cyclobenzaprine, morphine injection, nitroGLYCERIN, ondansetron (ZOFRAN) IV, sodium chloride flush, sodium chloride flush   Vital Signs    Vitals:   10/24/19 2100 10/25/19 0400 10/25/19 0605 10/25/19 0805  BP: 138/70  (!) 149/92 (!) 165/100  Pulse: 61  64 63  Resp: 18  20   Temp: 99.5 F (37.5 C)  98 F (36.7 C)   TempSrc: Oral  Oral   SpO2: 97%  96%   Weight:   108 kg   Height:  6\' 2"  (1.88 m)      Intake/Output Summary (Last 24 hours) at 10/25/2019 0856 Last data filed at 10/25/2019 0725 Gross per 24 hour  Intake 446.4 ml  Output 1100 ml  Net -653.6 ml   Last 3 Weights 10/25/2019 10/24/2019 10/21/2016  Weight (lbs) 238 lb 1.6 oz 246 lb 0.5 oz 246 lb  Weight (kg) 108 kg 111.6 kg 111.585 kg      Telemetry    SR, S brady to mid-50s overnight- Personally Reviewed  ECG    01/07 ECG is SR, HR 64, inf T waves and septal Q waves no sig change from 01/06 - Personally Reviewed  Physical Exam   General: Well developed, well nourished, male in no acute distress Head: Eyes PERRLA, Head normocephalic and atraumatic Lungs: clear bilaterally to  auscultation. Heart: HRRR S1 S2, without rub or gallop. No murmur. 4/4 extremity pulses are 2+ & equal. No JVD. Abdomen: Bowel sounds are present, abdomen soft and non-tender without masses or  hernias noted. Msk: Normal strength and tone for age. Extremities: No clubbing, cyanosis or edema.    Skin:  No rashes or lesions noted. Neuro: Alert and oriented X 3. Psych:  Good affect, responds appropriately  Labs    High Sensitivity Troponin:   Recent Labs  Lab 10/23/19 2302 10/24/19 0008 10/24/19 0509 10/24/19 1440 10/25/19 0635  TROPONINIHS 534* 939* 11,183* 17,601* 6,197*      Chemistry Recent Labs  Lab 10/23/19 2121 10/24/19 0509 10/25/19 0343  NA 141 141 140  K 3.8 3.5 3.6  CL 101 104 105  CO2 25 26 28   GLUCOSE 122* 118* 117*  BUN 19 20 16   CREATININE 1.33* 1.32* 1.29*  CALCIUM 8.7* 8.7* 8.7*  PROT  --   --  5.9*  ALBUMIN  --   --  3.4*  AST  --   --  65*  ALT  --   --  32  ALKPHOS  --   --  51  BILITOT  --   --  1.3*  GFRNONAA 57* 58* 59*  GFRAA >60 >60 >60  ANIONGAP 15 11 7      Hematology Recent Labs  Lab 10/23/19 2121 10/24/19 0509 10/25/19 0343  WBC 6.0 8.4 9.6  RBC 5.34 4.78 4.89  HGB 16.9 15.3 15.6  HCT 50.4 45.2 46.0  MCV 94.4 94.6 94.1  MCH 31.6 32.0 31.9  MCHC 33.5 33.8 33.9  RDW 12.8 12.8 13.2  PLT 231 214 206   Lab Results  Component Value Date   CHOL 176 10/24/2019   HDL 49 10/24/2019   LDLCALC 100 (H) 10/24/2019   TRIG 134 10/24/2019   CHOLHDL 3.6 10/24/2019   Lab Results  Component Value Date   HGBA1C 5.4 10/24/2019   Lab Results  Component Value Date   TSH 1.255 10/24/2019    Radiology    DG Chest 2 View  Result Date: 10/23/2019 CLINICAL DATA:  Chest pain EXAM: CHEST - 2 VIEW COMPARISON:  2016 FINDINGS: The heart size and mediastinal contours are within normal limits. Both lungs are clear. No pleural effusion. The visualized skeletal structures are unremarkable. IMPRESSION: No acute process in the chest. Electronically  Signed   By: Guadlupe SpanishPraneil  Patel M.D.   On: 10/23/2019 21:24   CARDIAC CATHETERIZATION  Result Date: 10/24/2019  RPAV lesion is 75% stenosed.  Prox RCA lesion is 60% stenosed.  Previously placed Dist LAD stent (unknown type) is widely patent.  Prox LAD to Mid LAD lesion is 60% stenosed.  1st Diag-1 lesion is 99% stenosed.  1st Diag-2 lesion is 99% stenosed.  Post intervention, there is a 25% residual stenosis.  Post intervention, there is a 80% residual stenosis.  There is mild left ventricular systolic dysfunction.  LV end diastolic pressure is normal.  The left ventricular ejection fraction is 50-55% by visual estimate.  Delma PostDaniel Folts is a 64 y.o. male  [id number] LOCATION:  FACILITY: [facility] PHYSICIAN: Nanetta BattyJonathan Berry, M.D. 04-Apr-1956 DATE OF PROCEDURE:  10/24/2019 DATE OF DISCHARGE: CARDIAC CATHETERIZATION / PCI Diag 1 History obtained from chart review.Delma PostDaniel Pitera is a 64 y.o. 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  He did have an episode of slow nonsustained VT versus AI VR at 430 this morning.  Troponins rose to 11,000. PROCEDURE DESCRIPTION: The patient was brought to the second floor [place] [facility] cath lab in the postabsorptive state. He was not premedicated . His right groin was prepped and shaved in usual sterile fashion. Xylocaine 1% was used for local anesthesia. A 6 French sheath was inserted into the right common femoral  artery using standard Seldinger technique.  5 French right left Judkins diagnostic catheters (Patel catheter used for selective coronary angiography left ventriculography respectively.  Isovue dye was used for the entirety of the case.  Retrograde aorta, ventricular and pullback pressures were recorded. Infarct-related artery was a small first diagonal branch.  Patient did have moderate  disease in his proximal LAD, proximal codominant right and PLA system.  He received Angiomax bolus followed by infusion with a therapeutic ACT.  He received Brilinta 180 mg p.o. loading dose.  Using an XB LAD 4.0 cm guide catheter along with a 0.14 comet DFR wire across the proximal LAD revealing a DFR morning of 0.95 suggesting that the proximal LAD lesion was not physiologically significant. I then  focused my attention on the infarct-related artery, the first diagonal branch.  I crossed the proximal subtotally occluded diagonal branch with a 0.14 Prowater wire and performed POBA with a 1.5 mm x 12 mm long balloon of the proximal and distal lesion.  I did have TIMI-3 flow at the end of the case.  While it was possible that a 2 mm stent could have been put in the proximal diagonal branch the distal branch remained hazy and I was concerned that there would be outflow issues contributing to acute stent thrombosis and therefore elected to treat only with balloon angioplasty.  Patient was pain-free at the end of the case.   Mr. Mittelstaedt had a subtotally occluded small first diagonal branch probably the infarct-related artery.  He had normal LV function with slight high anterolateral hypokinesia.  He does have moderate disease in his proximal codominant RCA as well as moderate least severe segmental disease in his PLA system.  After reviewing with Dr. Is Leeroy Bock the consensus is that this will best be treated in a staged fashion during his hospitalization for complete revascularization.  Angiomax was turned off.  The sheath will be removed.  The patient will go to a cardiac telemetry bed and be scheduled for staged RCA intervention in the next 24 to 48 hours. Quay Burow. MD, [facility] 10/24/2019 8:13 AM   ECHOCARDIOGRAM COMPLETE  Result Date: 10/24/2019   ECHOCARDIOGRAM REPORT   Patient Name:   ROSENDO COUSER Date of Exam: 10/24/2019 Medical Rec #:  [id number]     Height:       74.0 in Accession #:    [id number]     Weight:       246.0 lb Date of Birth:  1956-01-27     BSA:          2.37 m Patient Age:    89 years      BP:           121/65 mmHg Patient Gender: M             HR:           57 bpm. Exam Location:  Inpatient Procedure: 2D Echo, Cardiac Doppler and Color Doppler Indications:    Acute Myocardial Infarction 410.  History:        Patient has no prior history of Echocardiogram examinations.                 Previous Myocardial Infarction and CAD; Risk                 Factors:Hypertension and Dyslipidemia.  Sonographer:    Jonelle Sidle Dance Referring Phys: [id number] STEPHANIE FALK [place]  1. Left ventricular ejection fraction, by visual estimation, is 60 to 65%. The left ventricle has normal function. There is moderately increased left ventricular hypertrophy.  2. Left ventricular diastolic parameters are indeterminate.  3. Global right ventricle has normal systolic function.The right ventricular size is normal.  4. Left atrial size was mildly dilated.  5. Right atrial size was normal.  6. The mitral valve is normal in structure. Trivial mitral valve regurgitation.  7. The tricuspid valve is normal in structure.  8. The aortic valve is tricuspid. Aortic valve regurgitation is not visualized. No evidence of aortic valve sclerosis or stenosis.  9. The pulmonic valve was not well visualized. Pulmonic valve regurgitation is trivial. 10. There is mild dilatation of the aortic root measuring 41 mm. 11. TR signal is inadequate for assessing pulmonary artery  systolic pressure. 12. The inferior vena cava is normal in size with greater than 50% respiratory variability, suggesting right atrial pressure of 3 mmHg. FINDINGS  Left Ventricle: Left ventricular ejection fraction, by visual estimation, is 60 to 65%. The left ventricle has normal function. The left ventricle has no regional wall motion abnormalities. The left ventricular internal cavity size was the left ventricle is normal in size. There is moderately increased left  ventricular hypertrophy. Left ventricular diastolic parameters are indeterminate. Right Ventricle: The right ventricular size is normal. No increase in right ventricular wall thickness. Global RV systolic function is has normal systolic function. Left Atrium: Left atrial size was mildly dilated. Right Atrium: Right atrial size was normal in size Pericardium: There is no evidence of pericardial effusion. Mitral Valve: The mitral valve is normal in structure. Trivial mitral valve regurgitation. Tricuspid Valve: The tricuspid valve is normal in structure. Tricuspid valve regurgitation is not demonstrated. Aortic Valve: The aortic valve is tricuspid. Aortic valve regurgitation is not visualized. The aortic valve is structurally normal, with no evidence of sclerosis or stenosis. Pulmonic Valve: The pulmonic valve was not well visualized. Pulmonic valve regurgitation is trivial. Pulmonic regurgitation is trivial. Aorta: Aortic dilatation noted. There is mild dilatation of the aortic root measuring 41 mm. Venous: The inferior vena cava is normal in size with greater than 50% respiratory variability, suggesting right atrial pressure of 3 mmHg. IAS/Shunts: The interatrial septum was not well visualized.  LEFT VENTRICLE PLAX 2D LVIDd:         4.85 cm  Diastology LVIDs:         2.84 cm  LV e' lateral:   6.20 cm/s LV PW:         1.31 cm  LV E/e' lateral: 13.2 LV IVS:        1.31 cm  LV e' medial:    7.18 cm/s LVOT diam:     2.30 cm  LV E/e' medial:  11.4 LV SV:         80 ml LV SV Index:   32.58 LVOT Area:     4.15 cm  RIGHT VENTRICLE             IVC RV Basal diam:  2.79 cm     IVC diam: 1.62 cm RV S prime:     18.10 cm/s TAPSE (M-mode): 2.5 cm LEFT ATRIUM             Index       RIGHT ATRIUM           Index LA diam:        4.80 cm 2.02 cm/m  RA Area:     17.10 cm LA Vol (A2C):   98.4 ml 41.46 ml/m RA Volume:   46.70 ml  19.68 ml/m LA Vol (A4C):   79.4 ml 33.45 ml/m LA Biplane Vol: 89.2 ml 37.58 ml/m  AORTIC VALVE LVOT  Vmax:   128.00 cm/s LVOT Vmean:  81.100 cm/s LVOT VTI:    0.245 m  AORTA Ao Root diam: 4.10 cm Ao Asc diam:  3.30 cm MITRAL VALVE MV Area (PHT): 3.65 cm             SHUNTS MV PHT:        60.32 msec           Systemic VTI:  0.24 m MV Decel Time: 208 msec             Systemic Diam: 2.30 cm MV E velocity:  82.10 cm/s 103 cm/s MV A velocity: 79.40 cm/s 70.3 cm/s MV E/A ratio:  1.03       1.5  Epifanio Lesches MD Electronically signed by Epifanio Lesches MD Signature Date/Time: 10/24/2019/6:27:39 PM    Final     Cardiac Studies   ECHO: 10/24/2019  1. Left ventricular ejection fraction, by visual estimation, is 60 to 65%. The left ventricle has normal function. There is moderately increased left ventricular hypertrophy.  2. Left ventricular diastolic parameters are indeterminate.  3. Global right ventricle has normal systolic function.The right ventricular size is normal.  4. Left atrial size was mildly dilated.  5. Right atrial size was normal.  6. The mitral valve is normal in structure. Trivial mitral valve regurgitation.  7. The tricuspid valve is normal in structure.  8. The aortic valve is tricuspid. Aortic valve regurgitation is not visualized. No evidence of aortic valve sclerosis or stenosis.  9. The pulmonic valve was not well visualized. Pulmonic valve regurgitation is trivial. 10. There is mild dilatation of the aortic root measuring 41 mm. 11. TR signal is inadequate for assessing pulmonary artery systolic pressure. 12. The inferior vena cava is normal in size with greater than 50% respiratory variability, suggesting right atrial pressure of 3 mmHg.  LHC: 10/24/2019   RPAV lesion is 75% stenosed.  Prox RCA lesion is 60% stenosed.  Previously placed Dist LAD stent (unknown type) is widely patent.  Prox LAD to Mid LAD lesion is 60% stenosed.  1st Diag-1 lesion is 99% stenosed.  1st Diag-2 lesion is 99% stenosed.  Post intervention, there is a 25% residual stenosis.  Post  intervention, there is a 80% residual stenosis.  There is mild left ventricular systolic dysfunction.  LV end diastolic pressure is normal.  The left ventricular ejection fraction is 50-55% by visual estimate.  Patient Profile     64 y.o. 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  Assessment & Plan    1. CAD, NSTEMI, s/p LHC 01/06 - D1 & D2 subtotal>> s/p PTCA w/ 25% and 80% residual stenosis respectively - for staged PCI RCA today - s/p echo w/ nl EF, no WMA mentioned - on ASA, Brilinta, lisinopril and high-dose statin - encourage compliance as outpt, make sure he can afford Brilinta - Cr stable post-cath, continue hydration  2. Hypertension - BP up this am, 140s-150s, but just got meds, follow  3. Hyperlipidemia, LDL goal is < 70 - LDL 100 on profile above - was not on statin pta, believes diet/exercise are enough - encourage compliance  For questions or updates, please contact [facility] Please consult [email or web address] for contact info under     Signed, Theodore Demark, PA-C  10/25/2019, 8:56 AM    The patient was seen, examined and discussed with Theodore Demark, PA-C and I agree with the above.   The patient was asymptomatic overnight, he is ready for his stage procedure for intervention in RCA.  He agrees to that, it will be done by Dr. [place] will talk to him.  He is bradycardic, blood pressure is elevated this morning, he was started on lisinopril 20 mg daily.  His creatinine is stable and normal at 1.29, and GFR of 60.  Troponin severely elevated at 17,600 but then went down to 6200.  He can possibly be discharged later today.  Tobias Alexander, MD 10/25/2019

## 2019-10-25 NOTE — Interval H&P Note (Signed)
History and Physical Interval Note:  10/25/2019 9:46 AM  Donald Calhoun  has presented today for surgery, with the diagnosis of cad.  The various methods of treatment have been discussed with the patient and family. After consideration of risks, benefits and other options for treatment, the patient has consented to  Procedure(s): CORONARY STENT INTERVENTION (N/A) as a surgical intervention.  The patient's history has been reviewed, patient examined, no change in status, stable for surgery.  I have reviewed the patient's chart and labs.  Questions were answered to the patient's satisfaction.   Cath Lab Visit (complete for each Cath Lab visit)  Clinical Evaluation Leading to the Procedure:   ACS: Yes.    Non-ACS:    Anginal Classification: CCS II  Anti-ischemic medical therapy: Minimal Therapy (1 class of medications)  Non-Invasive Test Results: No non-invasive testing performed  Prior CABG: No previous CABG        Theron Arista [facility] 10/25/2019 9:46 AM

## 2019-10-25 NOTE — Progress Notes (Addendum)
Offered ambulation and education to pt this morning. He sts that he should be home and that he is frustrated that he is attached to wires. He sts he doesn't understand why he had to stay last night. I attempted to explain the importance of observation after his significant MI. He then asked me to "get out". I offered him materials but he declined. Referral for CRPII [place] is in from cath lab orders and we will check with him in the future to see if he is interested.  [id number] Ethelda Chick CES, ACSM 8:45 AM 10/25/2019

## 2019-10-25 NOTE — Care Management (Signed)
1214 10-25-19 Benefits Check submitted for Brilinta. Case Manager will discuss with patient if it will be ok to use the Transitions of Care Pharmacy- so medications can be delivered to bedside. Case Manager will follow for additional transition of care needs. Gala Lewandowsky, RN,BSN Case Manager [phone]

## 2019-10-25 NOTE — Progress Notes (Signed)
Pt agreed to education. Discussed MI, stents, Brilinta, restrictions, diet, exercise, NTG, and CRPII. Will refer to  [place] CRPII. Interested in virtual. Understands Brilinta importance. Pt is interested in participating in Virtual Cardiac and Pulmonary Rehab. Pt advised that Virtual Cardiac and Pulmonary Rehab is provided at no cost to the patient.  Checklist:  1. Pt has smart device  ie smartphone and/or ipad for downloading an app  Yes 2. Reliable internet/wifi service    Yes 3. Understands how to use their smartphone and navigate within an app.  Yes Pt verbalized understanding and is in agreement. [id number] Ethelda Chick CES, ACSM 1:21 PM 10/25/2019

## 2019-10-31 ENCOUNTER — Encounter (HOSPITAL_COMMUNITY): Payer: Self-pay | Admitting: *Deleted

## 2019-10-31 MED ORDER — CLOPIDOGREL BISULFATE 75 MG PO TABS: 75.0000 mg | ORAL_TABLET | Freq: Every day | ORAL | 3 refills | Status: AC

## 2019-10-31 NOTE — Progress Notes (Signed)
Referral received and verified for MD signature.  Follow up appt is 11/13/19. Due to the senior leadership directed instructions to pause onsite cardiac rehab in response to the surge of Covid + patients within the health system, will wait to check pt insurance benefits and eligibility once onsite cardiac rehab has reopened.  Pt will be contacted for scheduling after satisfactorily completing his follow up appt on 1/26. Alanson Aly, BSN Cardiac and Emergency planning/management officer

## 2019-11-02 ENCOUNTER — Ambulatory Visit: Payer: BC Managed Care – PPO | Admitting: General Practice

## 2019-11-08 NOTE — Progress Notes (Signed)
HPI: FU coronary artery disease. Patient was admitted to [facility] under the care of Dr. Sharyn Lull in August of 2014 with an acute inferior lateral myocardial infarction. Cardiac catheterization showed a distal 95-99% LAD apical stenosis. The patient had PCI of the distal LAD with a drug-eluting stent. Admitted January 2021 with non-ST elevation myocardial infarction.  Patient underwent cardiac catheterization which revealed a 60% proximal LAD with distal LAD stent widely patent.  There was a 99% first diagonal; 60% proximal RCA and 75% right posterior AV artery.  Patient had PCI of 2 lesions in the first diagonal.  Patient subsequently underwent staged intervention to the proximal RCA and RPAV lesion.  Echocardiogram January 2020 when showed normal LV function, moderate left ventricular hypertrophy, mild left atrial enlargement and mildly dilated aortic root.  Since last seen, the patient denies any dyspnea on exertion, orthopnea, PND, pedal edema, palpitations, syncope or chest pain.   Current Outpatient Medications  Medication Sig Dispense Refill  . ascorbic acid (VITAMIN C) 500 MG tablet Take 500 mg by mouth daily.    Marland Kitchen aspirin 81 MG EC tablet Take 1 tablet (81 mg total) by mouth daily. 30 tablet 11  . cholecalciferol (VITAMIN D3) 25 MCG (1000 UT) tablet Take 1,000 Units by mouth daily.    . clopidogrel (PLAVIX) 75 MG tablet Take 1 tablet (75 mg total) by mouth daily. 90 tablet 3  . cyclobenzaprine (FLEXERIL) 10 MG tablet Take 1 tablet by mouth 3 (three) times daily as needed for muscle spasms.     Marland Kitchen HYDROcodone-acetaminophen (NORCO/VICODIN) 5-325 MG tablet Take 1 tablet by mouth every 6 (six) hours as needed for moderate pain.   0  . magnesium oxide (MAG-OX) 400 MG tablet Take 400 mg by mouth daily.    . Multiple Vitamin (MULTIVITAMIN) tablet Take 1 tablet by mouth daily.    . nitroGLYCERIN (NITROSTAT) 0.4 MG SL tablet Place 1 tablet (0.4 mg total) under the tongue every 5 (five)  minutes x 3 doses as needed for chest pain. 25 tablet 3  . Omega-3 Fatty Acids (FISH OIL) 1000 MG CAPS Take 1,000 mg by mouth daily.    Bertram Gala Glycol-Propyl Glycol (SYSTANE OP) Place 1 drop into both eyes daily as needed (dry eyes).     Marland Kitchen testosterone cypionate (DEPOTESTOTERONE CYPIONATE) 200 MG/ML injection Inject 150 mg into the muscle once a week.     . Turmeric 500 MG CAPS Take 500 mg by mouth daily.    . vitamin E 1000 UNIT capsule Take 1,000 Units by mouth daily.    Marland Kitchen VYVANSE 50 MG capsule Take 50 mg by mouth daily as needed (ADD).   0  . zinc gluconate 50 MG tablet Take 50 mg by mouth daily.     No current facility-administered medications for this visit.     Past Medical History:  Diagnosis Date  . CAD (coronary artery disease)   . HTN (hypertension)   . Hyperlipidemia   . MI (myocardial infarction) (HCC) 2014    Past Surgical History:  Procedure Laterality Date  . ACHILLES TENDON REPAIR    . CORONARY BALLOON ANGIOPLASTY N/A 10/24/2019   Procedure: CORONARY BALLOON ANGIOPLASTY;  Surgeon: Runell Gess, MD;  Location: [facility] INVASIVE CV LAB;  Service: Cardiovascular;  Laterality: N/A;  . CORONARY STENT INTERVENTION N/A 10/25/2019   Procedure: CORONARY STENT INTERVENTION;  Surgeon: Swaziland, Peter M, MD;  Location: [facility] INVASIVE CV LAB;  Service: Cardiovascular;  Laterality: N/A;  .  INTRAVASCULAR PRESSURE WIRE/FFR STUDY N/A 10/24/2019   Procedure: INTRAVASCULAR PRESSURE WIRE/FFR STUDY;  Surgeon: Runell Gess, MD;  Location: [facility] INVASIVE CV LAB;  Service: Cardiovascular;  Laterality: N/A;  . LEFT HEART CATH AND CORONARY ANGIOGRAPHY N/A 10/24/2019   Procedure: LEFT HEART CATH AND CORONARY ANGIOGRAPHY;  Surgeon: Runell Gess, MD;  Location: [facility] INVASIVE CV LAB;  Service: Cardiovascular;  Laterality: N/A;  . LEFT HEART CATHETERIZATION WITH CORONARY ANGIOGRAM N/A 05/22/2013   Procedure: LEFT HEART CATHETERIZATION WITH CORONARY ANGIOGRAM;  Surgeon: Robynn Pane, MD;  Location: [facility]  CATH LAB;  Service: Cardiovascular;  Laterality: N/A;  . TONSILLECTOMY    . WISDOM TOOTH EXTRACTION      Social History   Socioeconomic History  . Marital status: Single    Spouse name: Not on file  . Number of children: 4  . Years of education: Not on file  . Highest education level: Not on file  Occupational History    Employer: [employer]  Tobacco Use  . Smoking status: Never Smoker  . Smokeless tobacco: Never Used  Substance and Sexual Activity  . Alcohol use: Yes  . Drug use: No  . Sexual activity: Not on file  Other Topics Concern  . Not on file  Social History Narrative  . Not on file   Social Determinants of Health   Financial Resource Strain:   . Difficulty of Paying Living Expenses: Not on file  Food Insecurity:   . Worried About Programme researcher, broadcasting/film/video in the Last Year: Not on file  . Ran Out of Food in the Last Year: Not on file  Transportation Needs:   . Lack of Transportation (Medical): Not on file  . Lack of Transportation (Non-Medical): Not on file  Physical Activity:   . Days of Exercise per Week: Not on file  . Minutes of Exercise per Session: Not on file  Stress:   . Feeling of Stress : Not on file  Social Connections:   . Frequency of Communication with Friends and Family: Not on file  . Frequency of Social Gatherings with Friends and Family: Not on file  . Attends Religious Services: Not on file  . Active Member of Clubs or Organizations: Not on file  . Attends Banker Meetings: Not on file  . Marital Status: Not on file  Intimate Partner Violence:   . Fear of Current or Ex-Partner: Not on file  . Emotionally Abused: Not on file  . Physically Abused: Not on file  . Sexually Abused: Not on file    Family History  Problem Relation Age of Onset  . Heart disease Other        No family history    ROS: no fevers or chills, productive cough, hemoptysis, dysphasia, odynophagia, melena, hematochezia, dysuria, hematuria, rash, seizure  activity, orthopnea, PND, pedal edema, claudication. Remaining systems are negative.  Physical Exam: Well-developed well-nourished in no acute distress.  Skin is warm and dry.  HEENT is normal.  Neck is supple.  Chest is clear to auscultation with normal expansion.  Cardiovascular exam is regular rate and rhythm.  Abdominal exam nontender or distended. No masses palpated. Extremities show no edema. neuro grossly intact  A/P  1 coronary artery disease-patient doing well with no chest pain.  Continue aspirin, plavix; did not tolerate brilinta; resume statin.  2 hypertension-blood pressure borderline; he would prefer not to take medications.  I have asked him to follow his blood pressure and to contact us if  his systolic is greater than 753 or diastolic greater than 85.  We will add an ACE inhibitor or beta-blocker if he is willing.  3 hyperlipidemia-he is now willing to take a statin after discussions.  Begin Crestor 40 mg daily.  Check lipids and liver in 12 weeks.  Kirk Ruths, MD

## 2019-11-13 ENCOUNTER — Other Ambulatory Visit: Payer: Self-pay

## 2019-11-13 ENCOUNTER — Encounter: Payer: Self-pay | Admitting: Cardiology

## 2019-11-13 ENCOUNTER — Ambulatory Visit (INDEPENDENT_AMBULATORY_CARE_PROVIDER_SITE_OTHER): Payer: BC Managed Care – PPO | Admitting: Cardiology

## 2019-11-13 VITALS — BP 134/86 | HR 71 | Ht 74.0 in | Wt 239.8 lb

## 2019-11-13 DIAGNOSIS — I214 Non-ST elevation (NSTEMI) myocardial infarction: Secondary | ICD-10-CM

## 2019-11-13 DIAGNOSIS — E7800 Pure hypercholesterolemia, unspecified: Secondary | ICD-10-CM | POA: Diagnosis not present

## 2019-11-13 DIAGNOSIS — I2510 Atherosclerotic heart disease of native coronary artery without angina pectoris: Secondary | ICD-10-CM

## 2019-11-13 DIAGNOSIS — I10 Essential (primary) hypertension: Secondary | ICD-10-CM | POA: Diagnosis not present

## 2019-11-13 MED ORDER — ROSUVASTATIN CALCIUM 40 MG PO TABS: 40.0000 mg | ORAL_TABLET | Freq: Every day | ORAL | 3 refills | Status: AC

## 2019-11-13 NOTE — Patient Instructions (Addendum)
Medication Instructions:  START ROSUVASTATIN 40 MG ONCE DAILY  *If you need a refill on your cardiac medications before your next appointment, please call your pharmacy*  Lab Work: Your physician recommends that you return for lab work in: 12 WEEKS PRIOR TO EATING  If you have labs (blood work) drawn today and your tests are completely normal, you will receive your results only by: Marland Kitchen MyChart Message (if you have MyChart) OR . A paper copy in the mail If you have any lab test that is abnormal or we need to change your treatment, we will call you to review the results.  Follow-Up: At [facility], you and your health needs are our priority.  As part of our continuing mission to provide you with exceptional heart care, we have created designated Provider Care Teams.  These Care Teams include your primary Cardiologist (physician) and Advanced Practice Providers (APPs -  Physician Assistants and Nurse Practitioners) who all work together to provide you with the care you need, when you need it.  Your next appointment:   6 month(s)  The format for your next appointment:   Either In Person or Virtual  Provider:   You may see Olga Millers MD or one of the following Advanced Practice Providers on your designated Care Team:    Corine Shelter, PA-C  [place], [place]  Edd Fabian, [place]

## 2019-11-15 DIAGNOSIS — Z0000 Encounter for general adult medical examination without abnormal findings: Secondary | ICD-10-CM | POA: Diagnosis not present

## 2019-11-22 ENCOUNTER — Telehealth (HOSPITAL_COMMUNITY): Payer: Self-pay | Admitting: *Deleted

## 2019-11-22 NOTE — Telephone Encounter (Signed)
Called and left message regarding scheduling for cardiac rehab.  Requested call back - contact phone number provided. Alanson Aly, BSN Cardiac and Emergency planning/management officer

## 2020-01-24 DIAGNOSIS — H02834 Dermatochalasis of left upper eyelid: Secondary | ICD-10-CM | POA: Diagnosis not present

## 2020-01-24 DIAGNOSIS — H57813 Brow ptosis, bilateral: Secondary | ICD-10-CM | POA: Diagnosis not present

## 2020-01-24 DIAGNOSIS — H0279 Other degenerative disorders of eyelid and periocular area: Secondary | ICD-10-CM | POA: Diagnosis not present

## 2020-01-24 DIAGNOSIS — H02413 Mechanical ptosis of bilateral eyelids: Secondary | ICD-10-CM | POA: Diagnosis not present

## 2020-01-24 DIAGNOSIS — H02831 Dermatochalasis of right upper eyelid: Secondary | ICD-10-CM | POA: Diagnosis not present

## 2020-01-31 DIAGNOSIS — H53483 Generalized contraction of visual field, bilateral: Secondary | ICD-10-CM | POA: Diagnosis not present

## 2020-02-19 DIAGNOSIS — G459 Transient cerebral ischemic attack, unspecified: Secondary | ICD-10-CM | POA: Diagnosis not present

## 2020-02-20 ENCOUNTER — Other Ambulatory Visit (HOSPITAL_COMMUNITY): Payer: Self-pay | Admitting: Family Medicine

## 2020-02-20 DIAGNOSIS — G459 Transient cerebral ischemic attack, unspecified: Secondary | ICD-10-CM

## 2020-02-21 ENCOUNTER — Other Ambulatory Visit: Payer: Self-pay | Admitting: Family Medicine

## 2020-02-21 DIAGNOSIS — G459 Transient cerebral ischemic attack, unspecified: Secondary | ICD-10-CM

## 2020-02-26 ENCOUNTER — Other Ambulatory Visit: Payer: BC Managed Care – PPO

## 2020-02-27 ENCOUNTER — Encounter: Payer: Self-pay | Admitting: Neurology

## 2020-03-12 ENCOUNTER — Ambulatory Visit
Admission: RE | Admit: 2020-03-12 | Discharge: 2020-03-12 | Disposition: A | Discharge status: Home / Self Care | Payer: BC Managed Care – PPO | Source: Ambulatory Visit | Attending: Family Medicine | Admitting: Family Medicine

## 2020-03-12 DIAGNOSIS — I6523 Occlusion and stenosis of bilateral carotid arteries: Secondary | ICD-10-CM | POA: Diagnosis not present

## 2020-03-12 DIAGNOSIS — G459 Transient cerebral ischemic attack, unspecified: Secondary | ICD-10-CM

## 2020-03-14 ENCOUNTER — Telehealth: Payer: Self-pay | Admitting: *Deleted

## 2020-03-14 NOTE — Telephone Encounter (Signed)
I tried to reach out to the Dr. Lamona Curl office though office is currently closed. Will try again on Tuesday 6/1 to reach out to surgeon's office to discuss further the urgency of procedure and recommendations.

## 2020-03-14 NOTE — Telephone Encounter (Signed)
Primary Dr Berlinda [facility] Pre-operative Risk Assessment      Request for surgical clearance:  1. What type of surgery is being performed?  BILATERAL ENDOSCOPIC BROW PTOSIS REPAIR CC: BILATERAL UPPER EYELID BLEPHAROPLASTY   2. When is this surgery scheduled? 03/31/20  3. What type of clearance is required (medical clearance vs. Pharmacy clearance to hold med vs. Both)?  MEDICAL   4. Are there any medications that need to be held prior to surgery and how long? ASPRIN , PLAVIX   5. Practice name and name of physician performing surgery? [facility] ; Dr Richrd Prime  6. What is the office phone number?  [phone]   7.   What is the office fax number? [phone]  8.   Anesthesia type (None, local, MAC, general) ? MAC   Raiford Simmonds 03/14/2020, 3:25 PM  _________________________________________________________________   (provider comments below)

## 2020-03-14 NOTE — Telephone Encounter (Signed)
   Primary Cardiologist: Tobias Alexander, MD  Chart reviewed as part of pre-operative protocol coverage.  It appears the patient had an NSTEMI 10/2019 with PCI/DES to PL branch of the RCA x2 and pRCA x1. He was recommended for uninterrupted DAPT for a minimum of 1 year.    Callback: - Please clarify the urgency of this procedure - If this cannot be delayed until 10/2020, then will route to Dr. Jens Som for his input on earliest date DAPT can be stopped. Thank you!  Beatriz Stallion, PA-C 03/14/2020, 4:42 PM

## 2020-03-18 ENCOUNTER — Other Ambulatory Visit: Payer: Self-pay

## 2020-03-18 ENCOUNTER — Encounter: Payer: Self-pay | Admitting: *Deleted

## 2020-03-18 ENCOUNTER — Ambulatory Visit (HOSPITAL_COMMUNITY): Payer: BC Managed Care – PPO | Attending: Cardiology

## 2020-03-18 DIAGNOSIS — G459 Transient cerebral ischemic attack, unspecified: Secondary | ICD-10-CM | POA: Insufficient documentation

## 2020-03-19 NOTE — Telephone Encounter (Signed)
2nd attempt to reach requesting office. No answer. Lvm

## 2020-03-22 IMAGING — DX DG CHEST 2V
2 series · 2 of 2 positions shown · non-contrast
Comparison: 4273

CLINICAL DATA: Chest pain

EXAM:
CHEST - 2 VIEW

[chest pa]
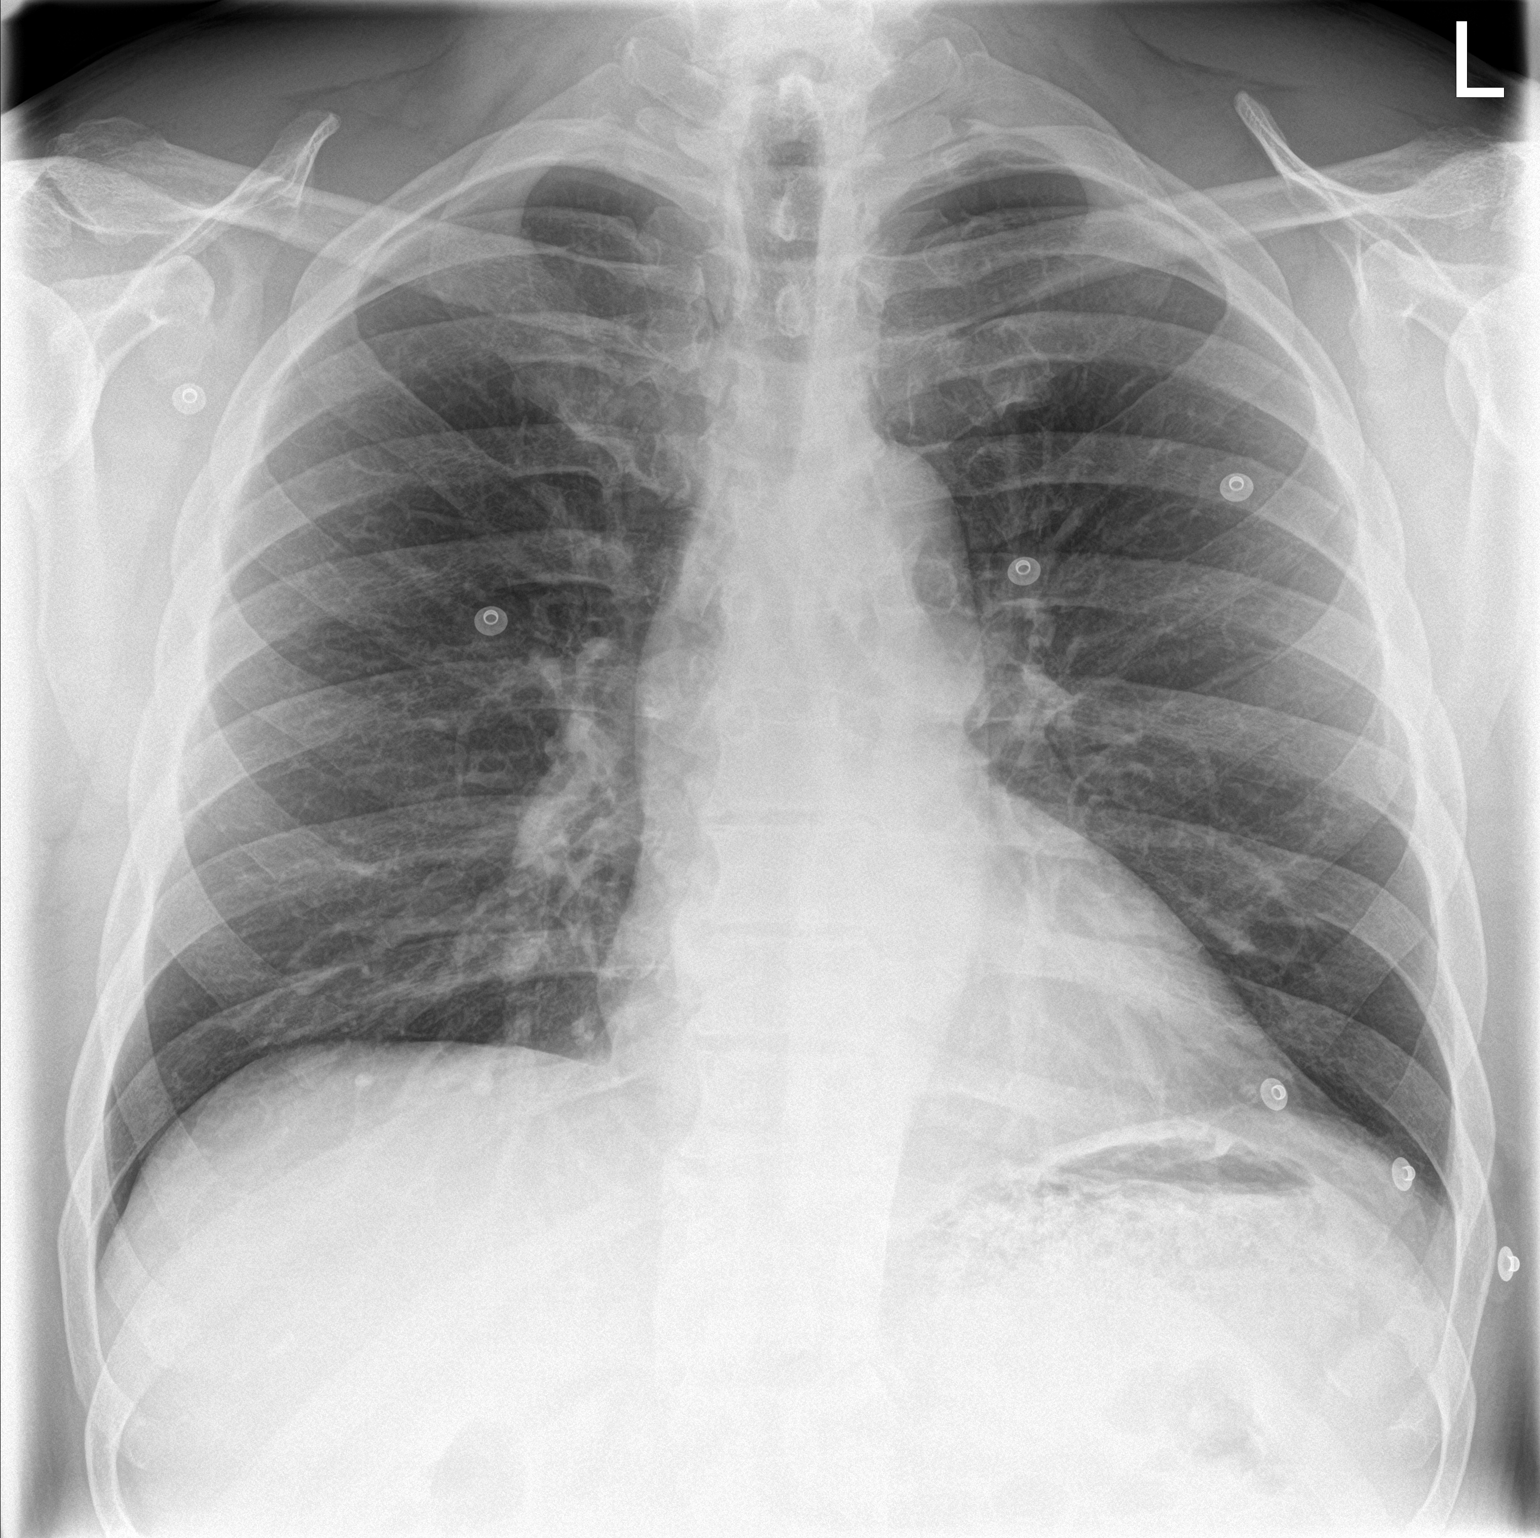

[chest lat]
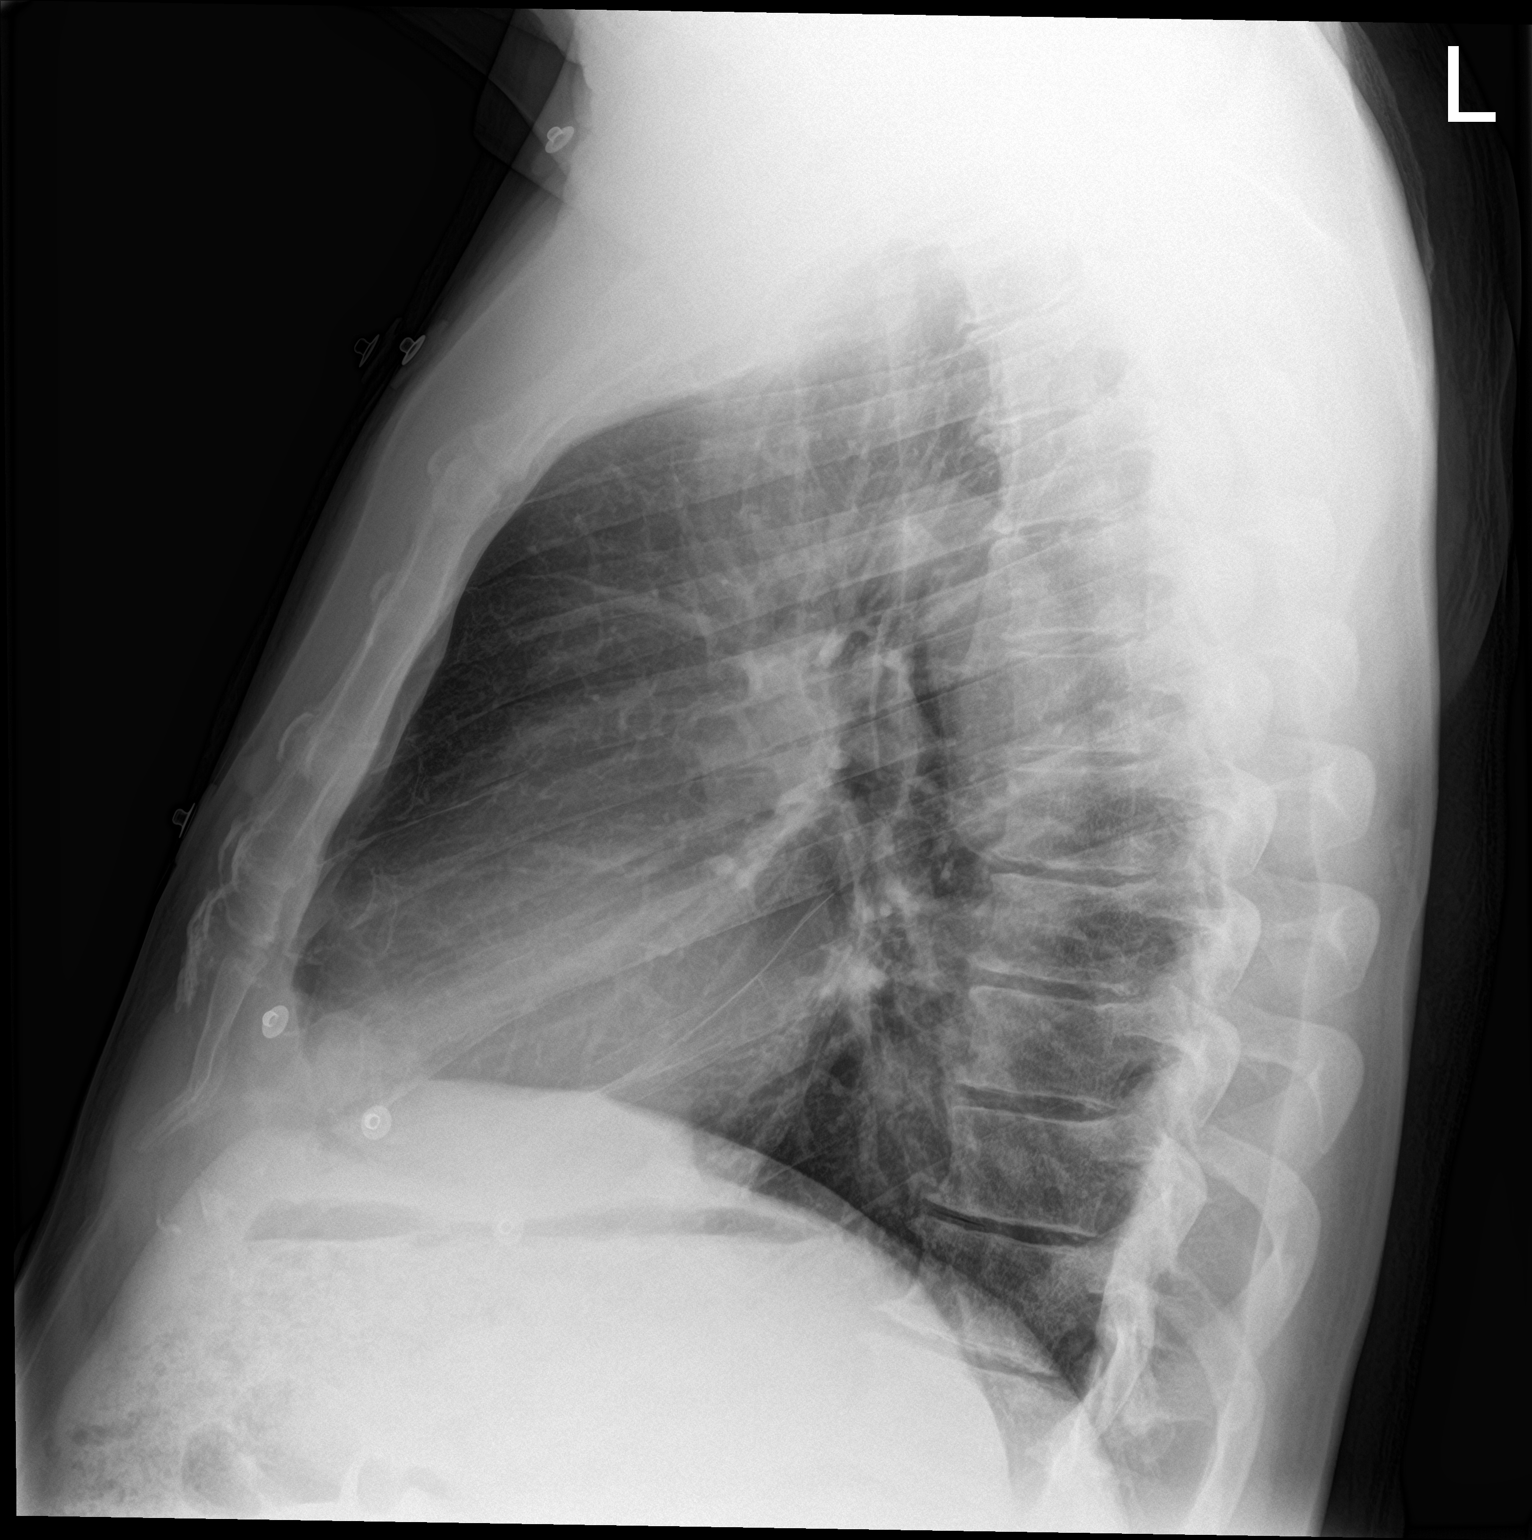

[2 of 2 positions shown; findings below may reference images not displayed]

FINDINGS: The heart size and mediastinal contours are within normal limits.
Both lungs are clear. No pleural effusion. The visualized skeletal
structures are unremarkable.
IMPRESSION: No acute process in the chest.

## 2020-03-25 NOTE — Telephone Encounter (Signed)
Dr. Linton Rump office calling back. Made them aware that the patient had NSTEMI 10/2019 with PCI/DES to PL branch of the RCA x2 and pRCA x 1 and was recommended uninterrupted DAPT x 1 year. Dr. Linton Rump nurse is going to ask him regarding the urgency of this procedure and if it can wait until 10/2020. She states that she will call us back to let us know. She also states that the procedure will be done under GENERAL Anesthesia now and not MAC.

## 2020-03-25 NOTE — Telephone Encounter (Signed)
Left message for Dr. Vella Raring office to call back regarding procedure.

## 2020-03-25 NOTE — Telephone Encounter (Signed)
Jenna from Dr Vella Raring office called back and stated will push pt's surgery out for 1 year to  (10/2020)./cy

## 2020-03-26 NOTE — Telephone Encounter (Signed)
Surgery is being postponed for 10/2020 given recent PCI. Therefore, will remove from pre-op pool and wait for new clear form closer to time of surgery.

## 2020-04-10 ENCOUNTER — Ambulatory Visit: Payer: BC Managed Care – PPO | Admitting: Neurology

## 2020-05-06 NOTE — Progress Notes (Deleted)
NEUROLOGY CONSULTATION NOTE  Donald Calhoun MRN: [id number] DOB: 03-19-1956  Referring provider: Shirlean Mylar, MD Primary care provider: Shirlean Mylar, MD  Reason for consult:  Transient right arm weakness  HISTORY OF PRESENT ILLNESS: Donald Calhoun is a 64 year old ***-handed male with HTN, CAD and HLD who presents for transient right arm weakness and movement.  History supplemented by referring provider's note.  In May, he was sitting at his desk when his right arm suddenly started shaking uncontrollably for *** followed by weakness and numbness ***.  Carotid doppler performed on 03/12/2020 showed minimal heterogeneous and calcified plaque but no hemodynamically significant stenosis.    PAST MEDICAL HISTORY: Past Medical History:  Diagnosis Date  . CAD (coronary artery disease)   . HTN (hypertension)   . Hyperlipidemia   . MI (myocardial infarction) (HCC) 2014    PAST SURGICAL HISTORY: Past Surgical History:  Procedure Laterality Date  . ACHILLES TENDON REPAIR    . CORONARY BALLOON ANGIOPLASTY N/A 10/24/2019   Procedure: CORONARY BALLOON ANGIOPLASTY;  Surgeon: Runell Gess, MD;  Location: [facility] INVASIVE CV LAB;  Service: Cardiovascular;  Laterality: N/A;  . CORONARY STENT INTERVENTION N/A 10/25/2019   Procedure: CORONARY STENT INTERVENTION;  Surgeon: Swaziland, Peter M, MD;  Location: [facility] INVASIVE CV LAB;  Service: Cardiovascular;  Laterality: N/A;  . INTRAVASCULAR PRESSURE WIRE/FFR STUDY N/A 10/24/2019   Procedure: INTRAVASCULAR PRESSURE WIRE/FFR STUDY;  Surgeon: Runell Gess, MD;  Location: [facility] INVASIVE CV LAB;  Service: Cardiovascular;  Laterality: N/A;  . LEFT HEART CATH AND CORONARY ANGIOGRAPHY N/A 10/24/2019   Procedure: LEFT HEART CATH AND CORONARY ANGIOGRAPHY;  Surgeon: Runell Gess, MD;  Location: [facility] INVASIVE CV LAB;  Service: Cardiovascular;  Laterality: N/A;  . LEFT HEART CATHETERIZATION WITH CORONARY ANGIOGRAM N/A 05/22/2013   Procedure: LEFT HEART CATHETERIZATION WITH  CORONARY ANGIOGRAM;  Surgeon: Robynn Pane, MD;  Location: [facility] CATH LAB;  Service: Cardiovascular;  Laterality: N/A;  . TONSILLECTOMY    . WISDOM TOOTH EXTRACTION      MEDICATIONS: Current Outpatient Medications on File Prior to Visit  Medication Sig Dispense Refill  . ascorbic acid (VITAMIN C) 500 MG tablet Take 500 mg by mouth daily.    Marland Kitchen aspirin 81 MG EC tablet Take 1 tablet (81 mg total) by mouth daily. 30 tablet 11  . cholecalciferol (VITAMIN D3) 25 MCG (1000 UT) tablet Take 1,000 Units by mouth daily.    . clopidogrel (PLAVIX) 75 MG tablet Take 1 tablet (75 mg total) by mouth daily. 90 tablet 3  . cyclobenzaprine (FLEXERIL) 10 MG tablet Take 1 tablet by mouth 3 (three) times daily as needed for muscle spasms.     Marland Kitchen HYDROcodone-acetaminophen (NORCO/VICODIN) 5-325 MG tablet Take 1 tablet by mouth every 6 (six) hours as needed for moderate pain.   0  . magnesium oxide (MAG-OX) 400 MG tablet Take 400 mg by mouth daily.    . Multiple Vitamin (MULTIVITAMIN) tablet Take 1 tablet by mouth daily.    . nitroGLYCERIN (NITROSTAT) 0.4 MG SL tablet Place 1 tablet (0.4 mg total) under the tongue every 5 (five) minutes x 3 doses as needed for chest pain. 25 tablet 3  . Omega-3 Fatty Acids (FISH OIL) 1000 MG CAPS Take 1,000 mg by mouth daily.    Bertram Gala Glycol-Propyl Glycol (SYSTANE OP) Place 1 drop into both eyes daily as needed (dry eyes).     . rosuvastatin (CRESTOR) 40 MG tablet Take 1 tablet (40 mg total) by mouth daily.  90 tablet 3  . testosterone cypionate (DEPOTESTOTERONE CYPIONATE) 200 MG/ML injection Inject 150 mg into the muscle once a week.     . Turmeric 500 MG CAPS Take 500 mg by mouth daily.    . vitamin E 1000 UNIT capsule Take 1,000 Units by mouth daily.    Marland Kitchen VYVANSE 50 MG capsule Take 50 mg by mouth daily as needed (ADD).   0  . zinc gluconate 50 MG tablet Take 50 mg by mouth daily.     No current facility-administered medications on file prior to visit.    ALLERGIES: No  Known Allergies  FAMILY HISTORY: Family History  Problem Relation Age of Onset  . Heart disease Other        No family history    SOCIAL HISTORY: Social History   Socioeconomic History  . Marital status: Single    Spouse name: Not on file  . Number of children: 4  . Years of education: Not on file  . Highest education level: Not on file  Occupational History    Employer: [employer]  Tobacco Use  . Smoking status: Never Smoker  . Smokeless tobacco: Never Used  Substance and Sexual Activity  . Alcohol use: Yes  . Drug use: No  . Sexual activity: Not on file  Other Topics Concern  . Not on file  Social History Narrative  . Not on file   Social Determinants of Health   Financial Resource Strain:   . Difficulty of Paying Living Expenses:   Food Insecurity:   . Worried About Programme researcher, broadcasting/film/video in the Last Year:   . Barista in the Last Year:   Transportation Needs:   . Freight forwarder (Medical):   Marland Kitchen Lack of Transportation (Non-Medical):   Physical Activity:   . Days of Exercise per Week:   . Minutes of Exercise per Session:   Stress:   . Feeling of Stress :   Social Connections:   . Frequency of Communication with Friends and Family:   . Frequency of Social Gatherings with Friends and Family:   . Attends Religious Services:   . Active Member of Clubs or Organizations:   . Attends Banker Meetings:   Marland Kitchen Marital Status:   Intimate Partner Violence:   . Fear of Current or Ex-Partner:   . Emotionally Abused:   Marland Kitchen Physically Abused:   . Sexually Abused:     PHYSICAL EXAM: *** General: No acute distress.  Patient appears well-groomed.  Head:  Normocephalic/atraumatic Eyes:  fundi examined but not visualized Neck: supple, no paraspinal tenderness, full range of motion Back: No paraspinal tenderness Heart: regular rate and rhythm Lungs: Clear to auscultation bilaterally. Vascular: No carotid bruits. Neurological Exam: Mental status:  alert and oriented to person, place, and time, recent and remote memory intact, fund of knowledge intact, attention and concentration intact, speech fluent and not dysarthric, language intact. Cranial nerves: CN I: not tested CN II: pupils equal, round and reactive to light, visual fields intact CN III, IV, VI:  full range of motion, no nystagmus, no ptosis CN V: facial sensation intact CN VII: upper and lower face symmetric CN VIII: hearing intact CN IX, X: gag intact, uvula midline CN XI: sternocleidomastoid and trapezius muscles intact CN XII: tongue midline Bulk & Tone: normal, no fasciculations. Motor:  5/5 throughout *** Sensation:  Pinprick *** temperature *** and vibration sensation intact.  ***. Deep Tendon Reflexes:  2+ throughout, *** toes downgoing.  ***  Finger to nose testing:  Without dysmetria.  *** Heel to shin:  Without dysmetria.  *** Gait:  Normal station and stride.  Able to turn and tandem walk. Romberg ***.  IMPRESSION: ***  PLAN: ***  Thank you for allowing me to take part in the care of this patient.  Shon Millet, DO  CC: Shirlean Mylar, MD

## 2020-05-07 NOTE — Progress Notes (Deleted)
HPI: FU coronary artery disease. Patient was admitted to [facility] under the care of Dr. Sharyn Lull in August of 2014 with an acute inferior lateral myocardial infarction. Cardiac catheterization showed a distal 95-99% LAD apical stenosis. The patient had PCI of the distal LAD with a drug-eluting stent. Admitted January 2021 with non-ST elevation myocardial infarction.  Patient underwent cardiac catheterization which revealed a 60% proximal LAD with distal LAD stent widely patent.  There was a 99% first diagonal; 60% proximal RCA and 75% right posterior AV artery.  Patient had PCI of 2 lesions in the first diagonal.  Patient subsequently underwent staged intervention to the proximal RCA and RPAV lesion.  Carotid dopplers 5/21 showed no significant stenosis. Echocardiogram 6/21 showed normal LV function, moderate left ventricular hypertrophy, grade 1 DD and mildly dilated aortic root.  Since last seen,  Current Outpatient Medications  Medication Sig Dispense Refill  . ascorbic acid (VITAMIN C) 500 MG tablet Take 500 mg by mouth daily.    Marland Kitchen aspirin 81 MG EC tablet Take 1 tablet (81 mg total) by mouth daily. 30 tablet 11  . cholecalciferol (VITAMIN D3) 25 MCG (1000 UT) tablet Take 1,000 Units by mouth daily.    . clopidogrel (PLAVIX) 75 MG tablet Take 1 tablet (75 mg total) by mouth daily. 90 tablet 3  . cyclobenzaprine (FLEXERIL) 10 MG tablet Take 1 tablet by mouth 3 (three) times daily as needed for muscle spasms.     Marland Kitchen HYDROcodone-acetaminophen (NORCO/VICODIN) 5-325 MG tablet Take 1 tablet by mouth every 6 (six) hours as needed for moderate pain.   0  . magnesium oxide (MAG-OX) 400 MG tablet Take 400 mg by mouth daily.    . Multiple Vitamin (MULTIVITAMIN) tablet Take 1 tablet by mouth daily.    . nitroGLYCERIN (NITROSTAT) 0.4 MG SL tablet Place 1 tablet (0.4 mg total) under the tongue every 5 (five) minutes x 3 doses as needed for chest pain. 25 tablet 3  . Omega-3 Fatty Acids (FISH  OIL) 1000 MG CAPS Take 1,000 mg by mouth daily.    Bertram Gala Glycol-Propyl Glycol (SYSTANE OP) Place 1 drop into both eyes daily as needed (dry eyes).     . rosuvastatin (CRESTOR) 40 MG tablet Take 1 tablet (40 mg total) by mouth daily. 90 tablet 3  . testosterone cypionate (DEPOTESTOTERONE CYPIONATE) 200 MG/ML injection Inject 150 mg into the muscle once a week.     . Turmeric 500 MG CAPS Take 500 mg by mouth daily.    . vitamin E 1000 UNIT capsule Take 1,000 Units by mouth daily.    Marland Kitchen VYVANSE 50 MG capsule Take 50 mg by mouth daily as needed (ADD).   0  . zinc gluconate 50 MG tablet Take 50 mg by mouth daily.     No current facility-administered medications for this visit.     Past Medical History:  Diagnosis Date  . CAD (coronary artery disease)   . HTN (hypertension)   . Hyperlipidemia   . MI (myocardial infarction) (HCC) 2014    Past Surgical History:  Procedure Laterality Date  . ACHILLES TENDON REPAIR    . CORONARY BALLOON ANGIOPLASTY N/A 10/24/2019   Procedure: CORONARY BALLOON ANGIOPLASTY;  Surgeon: Runell Gess, MD;  Location: [facility] INVASIVE CV LAB;  Service: Cardiovascular;  Laterality: N/A;  . CORONARY STENT INTERVENTION N/A 10/25/2019   Procedure: CORONARY STENT INTERVENTION;  Surgeon: Swaziland, Peter M, MD;  Location: [facility] INVASIVE CV LAB;  Service:  Cardiovascular;  Laterality: N/A;  . INTRAVASCULAR PRESSURE WIRE/FFR STUDY N/A 10/24/2019   Procedure: INTRAVASCULAR PRESSURE WIRE/FFR STUDY;  Surgeon: Runell Gess, MD;  Location: [facility] INVASIVE CV LAB;  Service: Cardiovascular;  Laterality: N/A;  . LEFT HEART CATH AND CORONARY ANGIOGRAPHY N/A 10/24/2019   Procedure: LEFT HEART CATH AND CORONARY ANGIOGRAPHY;  Surgeon: Runell Gess, MD;  Location: [facility] INVASIVE CV LAB;  Service: Cardiovascular;  Laterality: N/A;  . LEFT HEART CATHETERIZATION WITH CORONARY ANGIOGRAM N/A 05/22/2013   Procedure: LEFT HEART CATHETERIZATION WITH CORONARY ANGIOGRAM;  Surgeon: Robynn Pane, MD;   Location: [facility] CATH LAB;  Service: Cardiovascular;  Laterality: N/A;  . TONSILLECTOMY    . WISDOM TOOTH EXTRACTION      Social History   Socioeconomic History  . Marital status: Single    Spouse name: Not on file  . Number of children: 4  . Years of education: Not on file  . Highest education level: Not on file  Occupational History    Employer: [employer]  Tobacco Use  . Smoking status: Never Smoker  . Smokeless tobacco: Never Used  Substance and Sexual Activity  . Alcohol use: Yes  . Drug use: No  . Sexual activity: Not on file  Other Topics Concern  . Not on file  Social History Narrative  . Not on file   Social Determinants of Health   Financial Resource Strain:   . Difficulty of Paying Living Expenses:   Food Insecurity:   . Worried About Programme researcher, broadcasting/film/video in the Last Year:   . Barista in the Last Year:   Transportation Needs:   . Freight forwarder (Medical):   Marland Kitchen Lack of Transportation (Non-Medical):   Physical Activity:   . Days of Exercise per Week:   . Minutes of Exercise per Session:   Stress:   . Feeling of Stress :   Social Connections:   . Frequency of Communication with Friends and Family:   . Frequency of Social Gatherings with Friends and Family:   . Attends Religious Services:   . Active Member of Clubs or Organizations:   . Attends Banker Meetings:   Marland Kitchen Marital Status:   Intimate Partner Violence:   . Fear of Current or Ex-Partner:   . Emotionally Abused:   Marland Kitchen Physically Abused:   . Sexually Abused:     Family History  Problem Relation Age of Onset  . Heart disease Other        No family history    ROS: no fevers or chills, productive cough, hemoptysis, dysphasia, odynophagia, melena, hematochezia, dysuria, hematuria, rash, seizure activity, orthopnea, PND, pedal edema, claudication. Remaining systems are negative.  Physical Exam: Well-developed well-nourished in no acute distress.  Skin is warm and dry.    HEENT is normal.  Neck is supple.  Chest is clear to auscultation with normal expansion.  Cardiovascular exam is regular rate and rhythm.  Abdominal exam nontender or distended. No masses palpated. Extremities show no edema. neuro grossly intact  ECG- personally reviewed  A/P  1 CAD-continue ASA, plavix (DC 2/22) and statin.  2 hypertension-BP controlled; continue present meds and follow. Check Bmet.  3 hyperlipidemia-continue crestor; check lipids and liver.  Olga Millers, MD

## 2020-05-08 ENCOUNTER — Ambulatory Visit: Payer: BC Managed Care – PPO | Admitting: Neurology

## 2020-05-13 ENCOUNTER — Ambulatory Visit: Payer: BC Managed Care – PPO | Admitting: Cardiology

## 2020-07-09 DIAGNOSIS — M438X5 Other specified deforming dorsopathies, thoracolumbar region: Secondary | ICD-10-CM | POA: Diagnosis not present

## 2020-07-09 DIAGNOSIS — M47896 Other spondylosis, lumbar region: Secondary | ICD-10-CM | POA: Diagnosis not present

## 2020-07-09 DIAGNOSIS — M5440 Lumbago with sciatica, unspecified side: Secondary | ICD-10-CM | POA: Diagnosis not present

## 2020-07-09 DIAGNOSIS — M4726 Other spondylosis with radiculopathy, lumbar region: Secondary | ICD-10-CM | POA: Diagnosis not present

## 2020-07-10 ENCOUNTER — Telehealth: Payer: Self-pay | Admitting: Pulmonary Disease

## 2020-07-10 NOTE — Telephone Encounter (Signed)
Spoke with the pt  Advised pt that since not seen since 2015 needs to keep appt for CPAP to be ordered  Ins would need compliance supporting notes that we would not be able to provide without ov

## 2020-07-11 ENCOUNTER — Ambulatory Visit: Payer: BC Managed Care – PPO | Admitting: Pulmonary Disease

## 2020-07-31 ENCOUNTER — Other Ambulatory Visit: Payer: Self-pay

## 2020-07-31 ENCOUNTER — Ambulatory Visit (INDEPENDENT_AMBULATORY_CARE_PROVIDER_SITE_OTHER): Payer: BC Managed Care – PPO | Admitting: Pulmonary Disease

## 2020-07-31 ENCOUNTER — Encounter: Payer: Self-pay | Admitting: Pulmonary Disease

## 2020-07-31 VITALS — BP 122/68 | HR 56 | Temp 94.0°F | Ht 74.0 in | Wt 241.6 lb

## 2020-07-31 DIAGNOSIS — G4733 Obstructive sleep apnea (adult) (pediatric): Secondary | ICD-10-CM | POA: Diagnosis not present

## 2020-07-31 NOTE — Assessment & Plan Note (Signed)
He has gained about 13 pounds since his prior sleep study. Few residual events are present and he may need slightly increased pressure from 8 to 10 cm.  He is very compliant and CPAP is certainly helped improve his daytime somnolence and fatigue. We will go ahead with a new prescription for CPAP of 10 cm.  We will follow this up with a download check in 1 month and tweak settings as needed

## 2020-07-31 NOTE — Patient Instructions (Signed)
Prescription for CPAP 10 cm will be sent to DME Call us in 1 month after using the machine so we can review download and give you feedback

## 2020-07-31 NOTE — Progress Notes (Signed)
Subjective:    Patient ID: Donald Calhoun, male    DOB: 1956/08/27, 64 y.o.   MRN: [id number]  HPI  64 year old presents to reestablish care for OSA. 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  This has certainly helped improve his daytime somnolence and fatigue.  His CPAP machine is malfunctioning and he would like a new machine. He denies any problems with mask or pressure.  CPAP download was reviewed which shows excellent compliance and good control of events with residual AHI of 4/h on 8 cm but on a few nights events are as high as 10/hour Epworth sleepiness score is 5 and he denies daytime somnolence. Bedtime is between 9 and 10 PM, sleep latency is 30 minutes, he sleeps on his side with 2 pillows, reports numerous nocturnal awakenings and has out of bed by 8 AM feeling rested without dryness of mouth or headaches when he uses his machine. There is no history suggestive of cataplexy, sleep paralysis or parasomnias   PMH -  CAD with stent  He has several reservations about the Covid vaccine and he expressed his preference based on his research for medication such as hydroxychloroquine and ivermectin  Significant tests/ events reviewed  03/2014 wt 225 lbs, BMI 28, neck size 19 inches showed an AHI of 48/h with 40 central apneas and lowest desaturation to 81% of.  Events were controlled with CPAP of 8 cm with a medium fullface mask.     Past Medical History:  Diagnosis Date  . CAD (coronary artery disease)   . HTN (hypertension)   . Hyperlipidemia   . MI (myocardial infarction) (HCC) 2014   Past Surgical History:  Procedure Laterality Date  . ACHILLES TENDON REPAIR    . CORONARY BALLOON ANGIOPLASTY N/A 10/24/2019   Procedure: CORONARY BALLOON ANGIOPLASTY;  Surgeon: Runell Gess, MD;  Location: [facility] INVASIVE CV LAB;   Service: Cardiovascular;  Laterality: N/A;  . CORONARY STENT INTERVENTION N/A 10/25/2019   Procedure: CORONARY STENT INTERVENTION;  Surgeon: Swaziland, Peter M, MD;  Location: [facility] INVASIVE CV LAB;  Service: Cardiovascular;  Laterality: N/A;  . INTRAVASCULAR PRESSURE WIRE/FFR STUDY N/A 10/24/2019   Procedure: INTRAVASCULAR PRESSURE WIRE/FFR STUDY;  Surgeon: Runell Gess, MD;  Location: [facility] INVASIVE CV LAB;  Service: Cardiovascular;  Laterality: N/A;  . LEFT HEART CATH AND CORONARY ANGIOGRAPHY N/A 10/24/2019   Procedure: LEFT HEART CATH AND CORONARY ANGIOGRAPHY;  Surgeon: Runell Gess, MD;  Location: [facility] INVAS[facility] CV [facility];  Service: Cardiovascular;  Laterality: N/A;  . LEFT HEART CATHETERIZATION WITH CORONARY ANGIOGRAM N/A 05/22/2013   Procedure: LEFT HEART CATHETERIZATION WITH CORONARY ANGIOGRAM;  Surgeon: Robynn Pane, MD;  Location: [facility] CATH LAB;  Service: Cardiovascular;  Laterality: N/A;  . TONSILLECTOMY    . WISDOM TOOTH EXTRACTION      No Known Allergies  Social History   Socioeconomic History  . Marital status: Single    Spouse name: Not on file  . Number of children: 4  . Years of education: Not on file  . Highest education level: Not on file  Occupational History    Employer: [employer]  Tobacco Use  . Smoking status: Never Smoker  . Smokeless tobacco: Never Used  Substance and Sexual Activity  . Alcohol use: Yes  . Drug use: No  . Sexual activity:  Not on file  Other Topics Concern  . Not on file  Social History Narrative  . Not on file   Social Determinants of Health   Financial Resource Strain:   . Difficulty of Paying Living Expenses: Not on file  Food Insecurity:   . Worried About Programme researcher, broadcasting/film/video in the Last Year: Not on file  . Ran Out of Food in the Last Year: Not on file  Transportation Needs:   . Lack of Transportation (Medical): Not on file  . Lack of Transportation (Non-Medical): Not on file  Physical Activity:   . Days of Exercise per Week: Not on  file  . Minutes of Exercise per Session: Not on file  Stress:   . Feeling of Stress : Not on file  Social Connections:   . Frequency of Communication with Friends and Family: Not on file  . Frequency of Social Gatherings with Friends and Family: Not on file  . Attends Religious Services: Not on file  . Active Member of Clubs or Organizations: Not on file  . Attends Banker Meetings: Not on file  . Marital Status: Not on file  Intimate Partner Violence:   . Fear of Current or Ex-Partner: Not on file  . Emotionally Abused: Not on file  . Physically Abused: Not on file  . Sexually Abused: Not on file    Family History  Problem Relation Age of Onset  . Heart disease Other        No family history    Review of Systems Constitutional: negative for anorexia, fevers and sweats  Eyes: negative for irritation, redness and visual disturbance  Ears, nose, mouth, throat, and face: negative for earaches, epistaxis, nasal congestion and sore throat  Respiratory: negative for cough, dyspnea on exertion, sputum and wheezing  Cardiovascular: negative for chest pain, dyspnea, lower extremity edema, orthopnea, palpitations and syncope  Gastrointestinal: negative for abdominal pain, constipation, diarrhea, melena, nausea and vomiting  Genitourinary:negative for dysuria, frequency and hematuria  Hematologic/lymphatic: negative for bleeding, easy bruising and lymphadenopathy  Musculoskeletal:negative for arthralgias, muscle weakness and stiff joints  Neurological: negative for coordination problems, gait problems, headaches and weakness  Endocrine: negative for diabetic symptoms including polydipsia, polyuria and weight loss     Objective:   Physical Exam  Gen. Pleasant, obese, in no distress, normal affect ENT - no pallor,icterus, no post nasal drip, class 2-3 airway Neck: No JVD, no thyromegaly, no carotid bruits Lungs: no use of accessory muscles, no dullness to percussion,  decreased without rales or rhonchi  Cardiovascular: Rhythm regular, heart sounds  normal, no murmurs or gallops, no peripheral edema Abdomen: soft and non-tender, no hepatosplenomegaly, BS normal. Musculoskeletal: No deformities, no cyanosis or clubbing Neuro:  alert, non focal, no tremors       Assessment & Plan:

## 2020-08-04 ENCOUNTER — Ambulatory Visit: Payer: BC Managed Care – PPO | Admitting: Cardiology

## 2020-08-25 DIAGNOSIS — G4733 Obstructive sleep apnea (adult) (pediatric): Secondary | ICD-10-CM

## 2020-08-28 DIAGNOSIS — G894 Chronic pain syndrome: Secondary | ICD-10-CM | POA: Diagnosis not present

## 2020-08-28 DIAGNOSIS — E782 Mixed hyperlipidemia: Secondary | ICD-10-CM | POA: Diagnosis not present

## 2020-08-28 DIAGNOSIS — I10 Essential (primary) hypertension: Secondary | ICD-10-CM | POA: Diagnosis not present

## 2020-10-07 DIAGNOSIS — M47812 Spondylosis without myelopathy or radiculopathy, cervical region: Secondary | ICD-10-CM | POA: Diagnosis not present

## 2020-10-07 DIAGNOSIS — M19012 Primary osteoarthritis, left shoulder: Secondary | ICD-10-CM | POA: Diagnosis not present

## 2020-10-07 DIAGNOSIS — M19011 Primary osteoarthritis, right shoulder: Secondary | ICD-10-CM | POA: Diagnosis not present

## 2020-10-07 DIAGNOSIS — Z049 Encounter for examination and observation for unspecified reason: Secondary | ICD-10-CM | POA: Diagnosis not present

## 2020-10-07 DIAGNOSIS — M25511 Pain in right shoulder: Secondary | ICD-10-CM | POA: Diagnosis not present

## 2020-10-07 DIAGNOSIS — M47892 Other spondylosis, cervical region: Secondary | ICD-10-CM | POA: Diagnosis not present

## 2020-10-07 DIAGNOSIS — R937 Abnormal findings on diagnostic imaging of other parts of musculoskeletal system: Secondary | ICD-10-CM | POA: Diagnosis not present

## 2020-10-28 DIAGNOSIS — H53481 Generalized contraction of visual field, right eye: Secondary | ICD-10-CM | POA: Diagnosis not present

## 2020-10-28 DIAGNOSIS — H53482 Generalized contraction of visual field, left eye: Secondary | ICD-10-CM | POA: Diagnosis not present

## 2020-10-28 DIAGNOSIS — H53483 Generalized contraction of visual field, bilateral: Secondary | ICD-10-CM | POA: Diagnosis not present

## 2020-10-28 NOTE — Progress Notes (Deleted)
HPI: FU coronary artery disease. Patient was admitted to [facility] under the care of Dr. Sharyn Lull in August of 2014 with an acute inferior lateral myocardial infarction. Cardiac catheterization showed a distal 95-99% LAD apical stenosis. The patient had PCI of the distal LAD with a drug-eluting stent. Admitted January 2021 with non-ST elevation myocardial infarction.  Patient underwent cardiac catheterization which revealed a 60% proximal LAD with distal LAD stent widely patent.  There was a 99% first diagonal; 60% proximal RCA and 75% right posterior AV artery.  Patient had PCI of 2 lesions in the first diagonal.  Patient subsequently underwent staged intervention to the proximal RCA and RPAV lesion.  Carotid Dopplers May 2021 showed minimal plaque bilaterally.  Echocardiogram June 2021 showed normal LV function, moderate left ventricular hypertrophy, grade 1 diastolic dysfunction and mildly dilated ascending aorta.  Since last seen,  Current Outpatient Medications  Medication Sig Dispense Refill  . ascorbic acid (VITAMIN C) 500 MG tablet Take 500 mg by mouth daily.    Marland Kitchen aspirin 81 MG EC tablet Take 1 tablet (81 mg total) by mouth daily. 30 tablet 11  . cholecalciferol (VITAMIN D3) 25 MCG (1000 UT) tablet Take 1,000 Units by mouth daily.    . clopidogrel (PLAVIX) 75 MG tablet Take 1 tablet (75 mg total) by mouth daily. 90 tablet 3  . cyclobenzaprine (FLEXERIL) 10 MG tablet Take 1 tablet by mouth 3 (three) times daily as needed for muscle spasms.     Marland Kitchen HYDROcodone-acetaminophen (NORCO/VICODIN) 5-325 MG tablet Take 1 tablet by mouth every 6 (six) hours as needed for moderate pain.   0  . Multiple Vitamin (MULTIVITAMIN) tablet Take 1 tablet by mouth daily.    . nitroGLYCERIN (NITROSTAT) 0.4 MG SL tablet Place 1 tablet (0.4 mg total) under the tongue every 5 (five) minutes x 3 doses as needed for chest pain. 25 tablet 3  . Omega-3 Fatty Acids (FISH OIL) 1000 MG CAPS Take 1,000 mg by  mouth daily.    . rosuvastatin (CRESTOR) 40 MG tablet Take 1 tablet (40 mg total) by mouth daily. 90 tablet 3  . testosterone cypionate (DEPOTESTOTERONE CYPIONATE) 200 MG/ML injection Inject 150 mg into the muscle once a week.     . Turmeric 500 MG CAPS Take 500 mg by mouth daily.    . vitamin E 1000 UNIT capsule Take 1,000 Units by mouth daily.     No current facility-administered medications for this visit.     Past Medical History:  Diagnosis Date  . CAD (coronary artery disease)   . HTN (hypertension)   . Hyperlipidemia   . MI (myocardial infarction) (HCC) 2014    Past Surgical History:  Procedure Laterality Date  . ACHILLES TENDON REPAIR    . CORONARY BALLOON ANGIOPLASTY N/A 10/24/2019   Procedure: CORONARY BALLOON ANGIOPLASTY;  Surgeon: Runell Gess, MD;  Location: [facility] INVASIVE CV LAB;  Service: Cardiovascular;  Laterality: N/A;  . CORONARY STENT INTERVENTION N/A 10/25/2019   Procedure: CORONARY STENT INTERVENTION;  Surgeon: Swaziland, Peter M, MD;  [facility] INVASIVE CV LAB;  Service: Cardiovascular;  Laterality: N/A;  . INTRAVASCULAR PRESSURE WIRE/FFR STUDY N/A 10/24/2019   Procedure: INTRAVASCULAR PRESSURE WIRE/FFR STUDY;  Surgeon: Runell Gess, MD;  Location: [facility] INVASIVE CV LAB;  Service: Cardiovascular;  Laterality: N/A;  . LEFT HEART CATH AND CORONARY ANGIOGRAPHY N/A 10/24/2019   Procedure: LEFT HEART CATH AND CORONARY ANGIOGRAPHY;  Surgeon: Runell Gess, MD;  Location: [facility] INVASIVE  CV LAB;  Service: Cardiovascular;  Laterality: N/A;  . LEFT HEART CATHETERIZATION WITH CORONARY ANGIOGRAM N/A 05/22/2013   Procedure: LEFT HEART CATHETERIZATION WITH CORONARY ANGIOGRAM;  Surgeon: Robynn Pane, MD;  Location: [facility] CATH LAB;  Service: Cardiovascular;  Laterality: N/A;  . TONSILLECTOMY    . WISDOM TOOTH EXTRACTION      Social History   Socioeconomic History  . Marital status: Single    Spouse name: Not on file  . Number of children: 4  . Years of education: Not on  file  . Highest education level: Not on file  Occupational History    Employer: [employer]  Tobacco Use  . Smoking status: Never Smoker  . Smokeless tobacco: Never Used  Substance and Sexual Activity  . Alcohol use: Yes  . Drug use: No  . Sexual activity: Not on file  Other Topics Concern  . Not on file  Social History Narrative  . Not on file   Social Determinants of Health   Financial Resource Strain: Not on file  Food Insecurity: Not on file  Transportation Needs: Not on file  Physical Activity: Not on file  Stress: Not on file  Social Connections: Not on file  Intimate Partner Violence: Not on file    Family History  Problem Relation Age of Onset  . Heart disease Other        No family history    ROS: no fevers or chills, productive cough, hemoptysis, dysphasia, odynophagia, melena, hematochezia, dysuria, hematuria, rash, seizure activity, orthopnea, PND, pedal edema, claudication. Remaining systems are negative.  Physical Exam: Well-developed well-nourished in no acute distress.  Skin is warm and dry.  HEENT is normal.  Neck is supple.  Chest is clear to auscultation with normal expansion.  Cardiovascular exam is regular rate and rhythm.  Abdominal exam nontender or distended. No masses palpated. Extremities show no edema. neuro grossly intact  ECG- personally reviewed  A/P  1 coronary artery disease-patient doing well with no chest pain.  Continue medical therapy with aspirin and Plavix.  Continue statin.  He did not tolerate Brilinta previously.  2 hyperlipidemia-continue Crestor 40 mg daily.  Check lipids and liver.  3 hypertension-patient's blood pressure is controlled.  Continue present medications.  Check potassium and renal function.  Olga Millers, MD

## 2020-10-29 DIAGNOSIS — S338XXD Sprain of other parts of lumbar spine and pelvis, subsequent encounter: Secondary | ICD-10-CM | POA: Diagnosis not present

## 2020-10-29 DIAGNOSIS — M549 Dorsalgia, unspecified: Secondary | ICD-10-CM | POA: Diagnosis not present

## 2020-11-03 DIAGNOSIS — S338XXD Sprain of other parts of lumbar spine and pelvis, subsequent encounter: Secondary | ICD-10-CM | POA: Diagnosis not present

## 2020-11-03 DIAGNOSIS — M549 Dorsalgia, unspecified: Secondary | ICD-10-CM | POA: Diagnosis not present

## 2020-11-05 ENCOUNTER — Telehealth: Payer: Self-pay | Admitting: Cardiology

## 2020-11-05 NOTE — Telephone Encounter (Signed)
Left message for patient to call and reschedule (per his phone call  request) his 1.21.22 appointment with Dr. Jens Som.

## 2020-11-07 ENCOUNTER — Ambulatory Visit: Payer: BC Managed Care – PPO | Admitting: Cardiology

## 2020-12-12 DIAGNOSIS — M549 Dorsalgia, unspecified: Secondary | ICD-10-CM | POA: Diagnosis not present

## 2020-12-12 DIAGNOSIS — S338XXD Sprain of other parts of lumbar spine and pelvis, subsequent encounter: Secondary | ICD-10-CM | POA: Diagnosis not present

## 2020-12-17 DIAGNOSIS — S338XXD Sprain of other parts of lumbar spine and pelvis, subsequent encounter: Secondary | ICD-10-CM | POA: Diagnosis not present

## 2020-12-17 DIAGNOSIS — M549 Dorsalgia, unspecified: Secondary | ICD-10-CM | POA: Diagnosis not present

## 2021-01-01 ENCOUNTER — Encounter: Payer: Self-pay | Admitting: *Deleted

## 2021-01-01 NOTE — Progress Notes (Deleted)
HPI: FU coronary artery disease. Patient was admitted to [facility] under the care of Dr. Sharyn Lull in August of 2014 with an acute inferior lateral myocardial infarction. Cardiac catheterization showed a distal 95-99% LAD apical stenosis. The patient had PCI of the distal LAD with a drug-eluting stent. Admitted January 2021 with non-ST elevation myocardial infarction.  Patient underwent cardiac catheterization which revealed a 60% proximal LAD with distal LAD stent widely patent.  There was a 99% first diagonal; 60% proximal RCA and 75% right posterior AV artery.  Patient had PCI of 2 lesions in the first diagonal.  Patient subsequently underwent staged intervention to the proximal RCA and RPAV lesion.  Carotid Dopplers May 2021 showed minimal plaque.  Echocardiogram June 2021 showed normal LV function, moderate left ventricular hypertrophy, grade 1 diastolic dysfunction and mildly dilated ascending aorta at 40 mm.  Since last seen,  Current Outpatient Medications  Medication Sig Dispense Refill  . ascorbic acid (VITAMIN C) 500 MG tablet Take 500 mg by mouth daily.    Marland Kitchen aspirin 81 MG EC tablet Take 1 tablet (81 mg total) by mouth daily. 30 tablet 11  . cholecalciferol (VITAMIN D3) 25 MCG (1000 UT) tablet Take 1,000 Units by mouth daily.    . clopidogrel (PLAVIX) 75 MG tablet Take 1 tablet (75 mg total) by mouth daily. 90 tablet 3  . cyclobenzaprine (FLEXERIL) 10 MG tablet Take 1 tablet by mouth 3 (three) times daily as needed for muscle spasms.     Marland Kitchen HYDROcodone-acetaminophen (NORCO/VICODIN) 5-325 MG tablet Take 1 tablet by mouth every 6 (six) hours as needed for moderate pain.   0  . Multiple Vitamin (MULTIVITAMIN) tablet Take 1 tablet by mouth daily.    . nitroGLYCERIN (NITROSTAT) 0.4 MG SL tablet Place 1 tablet (0.4 mg total) under the tongue every 5 (five) minutes x 3 doses as needed for chest pain. 25 tablet 3  . Omega-3 Fatty Acids (FISH OIL) 1000 MG CAPS Take 1,000 mg by mouth  daily.    . rosuvastatin (CRESTOR) 40 MG tablet Take 1 tablet (40 mg total) by mouth daily. 90 tablet 3  . testosterone cypionate (DEPOTESTOTERONE CYPIONATE) 200 MG/ML injection Inject 150 mg into the muscle once a week.     . Turmeric 500 MG CAPS Take 500 mg by mouth daily.    . vitamin E 1000 UNIT capsule Take 1,000 Units by mouth daily.     No current facility-administered medications for this visit.     Past Medical History:  Diagnosis Date  . CAD (coronary artery disease)   . HTN (hypertension)   . Hyperlipidemia   . MI (myocardial infarction) (HCC) 2014    Past Surgical History:  Procedure Laterality Date  . ACHILLES TENDON REPAIR    . CORONARY BALLOON ANGIOPLASTY N/A 10/24/2019   Procedure: CORONARY BALLOON ANGIOPLASTY;  Surgeon: Runell Gess, MD;  Location: [facility] INVASIVE CV LAB;  Service: Cardiovascular;  Laterality: N/A;  . CORONARY STENT INTERVENTION N/A 10/25/2019   Procedure: CORONARY STENT INTERVENTION;  Surgeon: Swaziland, Peter M, MD;  Location: [facility] INVASIVE CV LAB;  Service: Cardiovascular;  Laterality: N/A;  . INTRAVASCULAR PRESSURE WIRE/FFR STUDY N/A 10/24/2019   Procedure: INTRAVASCULAR PRESSURE WIRE/FFR STUDY;  Surgeon: Runell Gess, MD;  Location: [facility] INVASIVE CV LAB;  Service: Cardiovascular;  Laterality: N/A;  . LEFT HEART CATH AND CORONARY ANGIOGRAPHY N/A 10/24/2019   Procedure: LEFT HEART CATH AND CORONARY ANGIOGRAPHY;  Surgeon: Runell Gess, MD;  Location:  [facility] INVASIVE CV LAB;  Service: Cardiovascular;  Laterality: N/A;  . LEFT HEART CATHETERIZATION WITH CORONARY ANGIOGRAM N/A 05/22/2013   Procedure: LEFT HEART CATHETERIZATION WITH CORONARY ANGIOGRAM;  Surgeon: Robynn Pane, MD;  Location: [facility] CATH LAB;  Service: Cardiovascular;  Laterality: N/A;  . TONSILLECTOMY    . WISDOM TOOTH EXTRACTION      Social History   Socioeconomic History  . Marital status: Single    Spouse name: Not on file  . Number of children: 4  . Years of education: Not on file  .  Highest education level: Not on file  Occupational History    Employer: [employer]  Tobacco Use  . Smoking status: Never Smoker  . Smokeless tobacco: Never Used  Substance and Sexual Activity  . Alcohol use: Yes  . Drug use: No  . Sexual activity: Not on file  Other Topics Concern  . Not on file  Social History Narrative  . Not on file   Social Determinants of Health   Financial Resource Strain: Not on file  Food Insecurity: Not on file  Transportation Needs: Not on file  Physical Activity: Not on file  Stress: Not on file  Social Connections: Not on file  Intimate Partner Violence: Not on file    Family History  Problem Relation Age of Onset  . Heart disease Other        No family history    ROS: no fevers or chills, productive cough, hemoptysis, dysphasia, odynophagia, melena, hematochezia, dysuria, hematuria, rash, seizure activity, orthopnea, PND, pedal edema, claudication. Remaining systems are negative.  Physical Exam: Well-developed well-nourished in no acute distress.  Skin is warm and dry.  HEENT is normal.  Neck is supple.  Chest is clear to auscultation with normal expansion.  Cardiovascular exam is regular rate and rhythm.  Abdominal exam nontender or distended. No masses palpated. Extremities show no edema. neuro grossly intact  ECG- personally reviewed  A/P  1 coronary artery disease-patient denies recurrent chest pain.  Continue aspirin and discontinue Plavix.  Continue statin.  2 hypertension-blood pressure controlled.  Continue present medications and follow.  3 hyperlipidemia-continue statin.  Check lipids and liver.  Olga Millers, MD

## 2021-01-01 NOTE — Telephone Encounter (Signed)
Lm to call back ./cy 

## 2021-01-02 NOTE — Telephone Encounter (Signed)
Appointment rescheduled to 03/24/21 at 8:00 am .Donald [place]

## 2021-01-05 ENCOUNTER — Ambulatory Visit: Payer: BC Managed Care – PPO | Admitting: Cardiology

## 2021-01-26 DIAGNOSIS — H02831 Dermatochalasis of right upper eyelid: Secondary | ICD-10-CM | POA: Diagnosis not present

## 2021-01-26 DIAGNOSIS — H57813 Brow ptosis, bilateral: Secondary | ICD-10-CM | POA: Diagnosis not present

## 2021-01-26 DIAGNOSIS — H02834 Dermatochalasis of left upper eyelid: Secondary | ICD-10-CM | POA: Diagnosis not present

## 2021-02-16 DIAGNOSIS — M549 Dorsalgia, unspecified: Secondary | ICD-10-CM | POA: Diagnosis not present

## 2021-02-16 DIAGNOSIS — S338XXD Sprain of other parts of lumbar spine and pelvis, subsequent encounter: Secondary | ICD-10-CM | POA: Diagnosis not present

## 2021-02-20 DIAGNOSIS — M79662 Pain in left lower leg: Secondary | ICD-10-CM | POA: Diagnosis not present

## 2021-03-10 NOTE — Progress Notes (Deleted)
HPI: FU coronary artery disease. Patient was admitted to [facility] under the care of Dr. Sharyn Lull in August of 2014 with an acute inferior lateral myocardial infarction. Cardiac catheterization showed a distal 95-99% LAD apical stenosis. The patient had PCI of the distal LAD with a drug-eluting stent. Admitted January 2021 with non-ST elevation myocardial infarction. Cardiac catheterization January 2021 showed ejection fraction 50 to 55%, 75% RPAV, 60% proximal right coronary artery, patent stent in the LAD, 60% proximal to mid LAD, 99% D1, 99% D2.  Patient had POBA of D1.  Patient then had staged PCI of the posterolateral branch of the right coronary artery as well as proximal right coronary artery with drug-eluting stents.  Carotid Dopplers May 2021 showed minimal plaque.  Echocardiogram June 2021 showed normal LV function, moderate left ventricular hypertrophy, grade 1 diastolic dysfunction, mildly dilated ascending aorta.  Since last seen,  Current Outpatient Medications  Medication Sig Dispense Refill  . ascorbic acid (VITAMIN C) 500 MG tablet Take 500 mg by mouth daily.    Marland Kitchen aspirin 81 MG EC tablet Take 1 tablet (81 mg total) by mouth daily. 30 tablet 11  . cholecalciferol (VITAMIN D3) 25 MCG (1000 UT) tablet Take 1,000 Units by mouth daily.    . clopidogrel (PLAVIX) 75 MG tablet Take 1 tablet (75 mg total) by mouth daily. 90 tablet 3  . cyclobenzaprine (FLEXERIL) 10 MG tablet Take 1 tablet by mouth 3 (three) times daily as needed for muscle spasms.     Marland Kitchen HYDROcodone-acetaminophen (NORCO/VICODIN) 5-325 MG tablet Take 1 tablet by mouth every 6 (six) hours as needed for moderate pain.   0  . Multiple Vitamin (MULTIVITAMIN) tablet Take 1 tablet by mouth daily.    . nitroGLYCERIN (NITROSTAT) 0.4 MG SL tablet Place 1 tablet (0.4 mg total) under the tongue every 5 (five) minutes x 3 doses as needed for chest pain. 25 tablet 3  . Omega-3 Fatty Acids (FISH OIL) 1000 MG CAPS Take 1,000  mg by mouth daily.    . rosuvastatin (CRESTOR) 40 MG tablet Take 1 tablet (40 mg total) by mouth daily. 90 tablet 3  . testosterone cypionate (DEPOTESTOTERONE CYPIONATE) 200 MG/ML injection Inject 150 mg into the muscle once a week.     . Turmeric 500 MG CAPS Take 500 mg by mouth daily.    . vitamin E 1000 UNIT capsule Take 1,000 Units by mouth daily.     No current facility-administered medications for this visit.     Past Medical History:  Diagnosis Date  . CAD (coronary artery disease)   . HTN (hypertension)   . Hyperlipidemia   . MI (myocardial infarction) (HCC) 2014    Past Surgical History:  Procedure Laterality Date  . ACHILLES TENDON REPAIR    . CORONARY BALLOON ANGIOPLASTY N/A 10/24/2019   Procedure: CORONARY BALLOON ANGIOPLASTY;  Surgeon: Runell Gess, MD;  Location: [facility] INVASIVE CV LAB;  Service: Cardiovascular;  Laterality: N/A;  . CORONARY STENT INTERVENTION N/A 10/25/2019   Procedure: CORONARY STENT INTERVENTION;  Surgeon: Swaziland, Peter M, MD;  Location: [facility] INVASIVE CV LAB;  Service: Cardiovascular;  Laterality: N/A;  . INTRAVASCULAR PRESSURE WIRE/FFR STUDY N/A 10/24/2019   Procedure: INTRAVASCULAR PRESSURE WIRE/FFR STUDY;  Surgeon: Runell Gess, MD;  Location: [facility] INVASIVE CV LAB;  Service: Cardiovascular;  Laterality: N/A;  . LEFT HEART CATH AND CORONARY ANGIOGRAPHY N/A 10/24/2019   Procedure: LEFT HEART CATH AND CORONARY ANGIOGRAPHY;  Surgeon: Runell Gess, MD;  Location: [facility] INVASIVE CV LAB;  Service: Cardiovascular;  Laterality: N/A;  . LEFT HEART CATHETERIZATION WITH CORONARY ANGIOGRAM N/A 05/22/2013   Procedure: LEFT HEART CATHETERIZATION WITH CORONARY ANGIOGRAM;  Surgeon: Robynn Pane, MD;  Location: [facility] CATH LAB;  Service: Cardiovascular;  Laterality: N/A;  . TONSILLECTOMY    . WISDOM TOOTH EXTRACTION      Social History   Socioeconomic History  . Marital status: Single    Spouse name: Not on file  . Number of children: 4  . Years of education: Not  on file  . Highest education level: Not on file  Occupational History    Employer: [employer]  Tobacco Use  . Smoking status: Never Smoker  . Smokeless tobacco: Never Used  Substance and Sexual Activity  . Alcohol use: Yes  . Drug use: No  . Sexual activity: Not on file  Other Topics Concern  . Not on file  Social History Narrative  . Not on file   Social Determinants of Health   Financial Resource Strain: Not on file  Food Insecurity: Not on file  Transportation Needs: Not on file  Physical Activity: Not on file  Stress: Not on file  Social Connections: Not on file  Intimate Partner Violence: Not on file    Family History  Problem Relation Age of Onset  . Heart disease Other        No family history    ROS: no fevers or chills, productive cough, hemoptysis, dysphasia, odynophagia, melena, hematochezia, dysuria, hematuria, rash, seizure activity, orthopnea, PND, pedal edema, claudication. Remaining systems are negative.  Physical Exam: Well-developed well-nourished in no acute distress.  Skin is warm and dry.  HEENT is normal.  Neck is supple.  Chest is clear to auscultation with normal expansion.  Cardiovascular exam is regular rate and rhythm.  Abdominal exam nontender or distended. No masses palpated. Extremities show no edema. neuro grossly intact  ECG- personally reviewed  A/P  1 coronary artery disease-patient denies recurrent chest pain.  Continue aspirin.  Discontinue Plavix.  Continue statin.  2 hypertension-  3 hyperlipidemia-continue statin.  Check lipids and liver.  Olga Millers, MD

## 2021-03-19 ENCOUNTER — Telehealth: Payer: Self-pay | Admitting: Cardiology

## 2021-03-19 DIAGNOSIS — I2510 Atherosclerotic heart disease of native coronary artery without angina pectoris: Secondary | ICD-10-CM

## 2021-03-19 DIAGNOSIS — E7800 Pure hypercholesterolemia, unspecified: Secondary | ICD-10-CM

## 2021-03-19 DIAGNOSIS — I10 Essential (primary) hypertension: Secondary | ICD-10-CM

## 2021-03-19 DIAGNOSIS — I214 Non-ST elevation (NSTEMI) myocardial infarction: Secondary | ICD-10-CM

## 2021-03-19 NOTE — Telephone Encounter (Signed)
Spoke to patient . RN asked where [place] Calhoun [place] located, to better give Dr Jens Som an idea where patient is located.  Patient states Donald [place] , [place] is located near [place] ,near [facility]  Patient aware will defer to Dr Jens Som, if he knows of a Cardiologist or cardiology group in the area.

## 2021-03-19 NOTE — Telephone Encounter (Signed)
PT NO LONGER LIVES IN [place] OR [place] AREAS AND WOULD LIKE A REFERRAL TO SEE A CARDIOLOGIST LOCAL TO HIM

## 2021-03-20 NOTE — Telephone Encounter (Signed)
Spoke with pt, Aware of dr crenshaw's recommendations. Referral placed. ° °

## 2021-03-24 ENCOUNTER — Ambulatory Visit: Payer: BC Managed Care – PPO | Admitting: Cardiology

## 2021-06-19 DIAGNOSIS — M25512 Pain in left shoulder: Secondary | ICD-10-CM | POA: Diagnosis not present

## 2021-06-19 DIAGNOSIS — M24812 Other specific joint derangements of left shoulder, not elsewhere classified: Secondary | ICD-10-CM | POA: Diagnosis not present

## 2021-07-01 DIAGNOSIS — G894 Chronic pain syndrome: Secondary | ICD-10-CM | POA: Diagnosis not present

## 2021-09-28 DIAGNOSIS — L309 Dermatitis, unspecified: Secondary | ICD-10-CM | POA: Diagnosis not present

## 2021-09-28 DIAGNOSIS — Z7689 Persons encountering health services in other specified circumstances: Secondary | ICD-10-CM | POA: Diagnosis not present

## 2021-09-28 DIAGNOSIS — E349 Endocrine disorder, unspecified: Secondary | ICD-10-CM | POA: Diagnosis not present

## 2021-09-28 DIAGNOSIS — M158 Other polyosteoarthritis: Secondary | ICD-10-CM | POA: Diagnosis not present

## 2021-10-26 DIAGNOSIS — L570 Actinic keratosis: Secondary | ICD-10-CM | POA: Diagnosis not present

## 2021-10-26 DIAGNOSIS — L538 Other specified erythematous conditions: Secondary | ICD-10-CM | POA: Diagnosis not present

## 2021-10-26 DIAGNOSIS — L853 Xerosis cutis: Secondary | ICD-10-CM | POA: Diagnosis not present

## 2021-10-26 DIAGNOSIS — D492 Neoplasm of unspecified behavior of bone, soft tissue, and skin: Secondary | ICD-10-CM | POA: Diagnosis not present

## 2021-10-26 DIAGNOSIS — D225 Melanocytic nevi of trunk: Secondary | ICD-10-CM | POA: Diagnosis not present

## 2021-10-26 DIAGNOSIS — L814 Other melanin hyperpigmentation: Secondary | ICD-10-CM | POA: Diagnosis not present

## 2021-10-26 DIAGNOSIS — L578 Other skin changes due to chronic exposure to nonionizing radiation: Secondary | ICD-10-CM | POA: Diagnosis not present

## 2021-10-26 DIAGNOSIS — L820 Inflamed seborrheic keratosis: Secondary | ICD-10-CM | POA: Diagnosis not present

## 2021-10-26 DIAGNOSIS — L298 Other pruritus: Secondary | ICD-10-CM | POA: Diagnosis not present

## 2021-11-05 DIAGNOSIS — Z1211 Encounter for screening for malignant neoplasm of colon: Secondary | ICD-10-CM | POA: Diagnosis not present

## 2021-11-25 DIAGNOSIS — L2089 Other atopic dermatitis: Secondary | ICD-10-CM | POA: Diagnosis not present

## 2021-11-25 DIAGNOSIS — D225 Melanocytic nevi of trunk: Secondary | ICD-10-CM | POA: Diagnosis not present

## 2022-05-21 DIAGNOSIS — K580 Irritable bowel syndrome with diarrhea: Secondary | ICD-10-CM | POA: Diagnosis not present

## 2022-05-21 DIAGNOSIS — M158 Other polyosteoarthritis: Secondary | ICD-10-CM | POA: Diagnosis not present

## 2022-05-21 DIAGNOSIS — R030 Elevated blood-pressure reading, without diagnosis of hypertension: Secondary | ICD-10-CM | POA: Diagnosis not present

## 2022-05-21 DIAGNOSIS — E349 Endocrine disorder, unspecified: Secondary | ICD-10-CM | POA: Diagnosis not present

## 2022-05-25 DIAGNOSIS — D225 Melanocytic nevi of trunk: Secondary | ICD-10-CM | POA: Diagnosis not present

## 2022-05-25 DIAGNOSIS — L578 Other skin changes due to chronic exposure to nonionizing radiation: Secondary | ICD-10-CM | POA: Diagnosis not present

## 2022-05-25 DIAGNOSIS — L814 Other melanin hyperpigmentation: Secondary | ICD-10-CM | POA: Diagnosis not present

## 2022-05-25 DIAGNOSIS — L218 Other seborrheic dermatitis: Secondary | ICD-10-CM | POA: Diagnosis not present

## 2022-09-21 ENCOUNTER — Telehealth: Payer: Self-pay | Admitting: *Deleted

## 2022-09-21 NOTE — Patient Outreach (Signed)
  Care Coordination   09/21/2022 Name: Donald Calhoun MRN: [id number] DOB: January 31, 1956   Care Coordination Outreach Attempts:  An unsuccessful telephone outreach was attempted today to offer the patient information about available care coordination services as a benefit of their health plan.   Follow Up Plan:  Additional outreach attempts will be made to offer the patient care coordination information and services.   Encounter Outcome:  No Answer   Care Coordination Interventions:  No, not indicated    Elliot Cousin, RN Care Management Coordinator [facility] [employer] Main Office [phone]

## 2022-09-27 DIAGNOSIS — E349 Endocrine disorder, unspecified: Secondary | ICD-10-CM | POA: Diagnosis not present

## 2022-09-27 DIAGNOSIS — R030 Elevated blood-pressure reading, without diagnosis of hypertension: Secondary | ICD-10-CM | POA: Diagnosis not present

## 2022-09-27 DIAGNOSIS — K580 Irritable bowel syndrome with diarrhea: Secondary | ICD-10-CM | POA: Diagnosis not present

## 2022-09-27 DIAGNOSIS — M158 Other polyosteoarthritis: Secondary | ICD-10-CM | POA: Diagnosis not present
